# Patient Record
Sex: Female | Born: 1995 | Hispanic: Yes | Marital: Single | Smoking: Never smoker
Health system: Southern US, Community
[De-identification: ages and names within clinical notes are randomized; demographics above are authoritative.]

## PROBLEM LIST (undated history)

## (undated) DIAGNOSIS — F32A Depression, unspecified: Secondary | ICD-10-CM

## (undated) DIAGNOSIS — M419 Scoliosis, unspecified: Secondary | ICD-10-CM

## (undated) DIAGNOSIS — F419 Anxiety disorder, unspecified: Secondary | ICD-10-CM

## (undated) HISTORY — PX: ADENOIDECTOMY: SUR15

## (undated) HISTORY — DX: Anxiety disorder, unspecified: F41.9

## (undated) HISTORY — DX: Scoliosis, unspecified: M41.9

## (undated) HISTORY — PX: TONSILLECTOMY: SUR1361

## (undated) HISTORY — DX: Depression, unspecified: F32.A

---
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
## 2020-07-04 ENCOUNTER — Emergency Department (HOSPITAL_BASED_OUTPATIENT_CLINIC_OR_DEPARTMENT_OTHER): Payer: BC Managed Care – PPO

## 2020-07-04 ENCOUNTER — Encounter (HOSPITAL_BASED_OUTPATIENT_CLINIC_OR_DEPARTMENT_OTHER): Payer: Self-pay | Admitting: Emergency Medicine

## 2020-07-04 ENCOUNTER — Other Ambulatory Visit: Payer: Self-pay

## 2020-07-04 ENCOUNTER — Inpatient Hospital Stay (HOSPITAL_BASED_OUTPATIENT_CLINIC_OR_DEPARTMENT_OTHER)
Admission: EM | Admit: 2020-07-04 | Discharge: 2020-07-07 | DRG: 872 | Disposition: A | Discharge status: Home / Self Care | Payer: BC Managed Care – PPO | Attending: Family Medicine | Admitting: Family Medicine

## 2020-07-04 DIAGNOSIS — A419 Sepsis, unspecified organism: Secondary | ICD-10-CM | POA: Diagnosis present

## 2020-07-04 DIAGNOSIS — A4151 Sepsis due to Escherichia coli [E. coli]: Secondary | ICD-10-CM | POA: Diagnosis not present

## 2020-07-04 DIAGNOSIS — D649 Anemia, unspecified: Secondary | ICD-10-CM | POA: Diagnosis present

## 2020-07-04 DIAGNOSIS — Z20822 Contact with and (suspected) exposure to covid-19: Secondary | ICD-10-CM | POA: Diagnosis present

## 2020-07-04 DIAGNOSIS — E871 Hypo-osmolality and hyponatremia: Secondary | ICD-10-CM | POA: Diagnosis present

## 2020-07-04 DIAGNOSIS — R1031 Right lower quadrant pain: Secondary | ICD-10-CM | POA: Diagnosis not present

## 2020-07-04 DIAGNOSIS — Z885 Allergy status to narcotic agent status: Secondary | ICD-10-CM

## 2020-07-04 DIAGNOSIS — N12 Tubulo-interstitial nephritis, not specified as acute or chronic: Secondary | ICD-10-CM | POA: Diagnosis present

## 2020-07-04 DIAGNOSIS — F319 Bipolar disorder, unspecified: Secondary | ICD-10-CM | POA: Diagnosis present

## 2020-07-04 DIAGNOSIS — N10 Acute tubulo-interstitial nephritis: Secondary | ICD-10-CM | POA: Diagnosis present

## 2020-07-04 DIAGNOSIS — E860 Dehydration: Secondary | ICD-10-CM | POA: Diagnosis present

## 2020-07-04 DIAGNOSIS — D696 Thrombocytopenia, unspecified: Secondary | ICD-10-CM | POA: Diagnosis present

## 2020-07-04 DIAGNOSIS — E876 Hypokalemia: Secondary | ICD-10-CM | POA: Diagnosis present

## 2020-07-04 LAB — CBC WITH DIFFERENTIAL/PLATELET
Abs Immature Granulocytes: 0.07 10*3/uL (ref 0.00–0.07)
Basophils Absolute: 0 10*3/uL (ref 0.0–0.1)
Basophils Relative: 0 %
Eosinophils Absolute: 0 10*3/uL (ref 0.0–0.5)
Eosinophils Relative: 0 %
HCT: 37.5 % (ref 36.0–46.0)
Hemoglobin: 12.1 g/dL (ref 12.0–15.0)
Immature Granulocytes: 1 %
Lymphocytes Relative: 6 %
Lymphs Abs: 0.7 10*3/uL (ref 0.7–4.0)
MCH: 30.8 pg (ref 26.0–34.0)
MCHC: 32.3 g/dL (ref 30.0–36.0)
MCV: 95.4 fL (ref 80.0–100.0)
Monocytes Absolute: 0.7 10*3/uL (ref 0.1–1.0)
Monocytes Relative: 6 %
Neutro Abs: 10.4 10*3/uL — ABNORMAL HIGH (ref 1.7–7.7)
Neutrophils Relative %: 87 %
Platelets: 141 10*3/uL — ABNORMAL LOW (ref 150–400)
RBC: 3.93 MIL/uL (ref 3.87–5.11)
RDW: 13.1 % (ref 11.5–15.5)
WBC: 11.9 10*3/uL — ABNORMAL HIGH (ref 4.0–10.5)
nRBC: 0 % (ref 0.0–0.2)

## 2020-07-04 LAB — LACTIC ACID, PLASMA: Lactic Acid, Venous: 1.2 mmol/L (ref 0.5–1.9)

## 2020-07-04 LAB — APTT: aPTT: 31 seconds (ref 24–36)

## 2020-07-04 LAB — COMPREHENSIVE METABOLIC PANEL
ALT: 19 U/L (ref 0–44)
AST: 19 U/L (ref 15–41)
Albumin: 4.2 g/dL (ref 3.5–5.0)
Alkaline Phosphatase: 61 U/L (ref 38–126)
Anion gap: 12 (ref 5–15)
BUN: 8 mg/dL (ref 6–20)
CO2: 20 mmol/L — ABNORMAL LOW (ref 22–32)
Calcium: 8.7 mg/dL — ABNORMAL LOW (ref 8.9–10.3)
Chloride: 98 mmol/L (ref 98–111)
Creatinine, Ser: 0.7 mg/dL (ref 0.44–1.00)
GFR calc Af Amer: >60 mL/min (ref >60)
GFR calc non Af Amer: >60 mL/min (ref >60)
Glucose, Bld: 127 mg/dL — ABNORMAL HIGH (ref 70–99)
Potassium: 3.4 mmol/L — ABNORMAL LOW (ref 3.5–5.1)
Sodium: 130 mmol/L — ABNORMAL LOW (ref 135–145)
Total Bilirubin: 0.7 mg/dL (ref 0.3–1.2)
Total Protein: 7.8 g/dL (ref 6.5–8.1)

## 2020-07-04 LAB — HCG, SERUM, QUALITATIVE: Preg, Serum: NEGATIVE

## 2020-07-04 LAB — PROTIME-INR
INR: 1.1 (ref 0.8–1.2)
Prothrombin Time: 14 seconds (ref 11.4–15.2)

## 2020-07-04 MED ORDER — FENTANYL CITRATE (PF) 100 MCG/2ML IJ SOLN
50.0000 ug | Freq: Once | INTRAMUSCULAR | Status: AC
  Administered 2020-07-04: 50 ug via INTRAVENOUS
  Filled 2020-07-04: qty 2

## 2020-07-04 MED ORDER — IBUPROFEN 400 MG PO TABS
600.0000 mg | ORAL_TABLET | Freq: Once | ORAL | Status: AC
  Administered 2020-07-04: 600 mg via ORAL
  Filled 2020-07-04: qty 1

## 2020-07-04 MED ORDER — SODIUM CHLORIDE 0.9 % IV SOLN
2.0000 g | Freq: Once | INTRAVENOUS | Status: AC
  Administered 2020-07-04: 2 g via INTRAVENOUS
  Filled 2020-07-04: qty 20

## 2020-07-04 MED ORDER — LACTATED RINGERS IV SOLN: INTRAVENOUS | Status: DC

## 2020-07-04 MED ORDER — METRONIDAZOLE IN NACL 5-0.79 MG/ML-% IV SOLN
500.0000 mg | Freq: Once | INTRAVENOUS | Status: AC
  Administered 2020-07-05: 500 mg via INTRAVENOUS
  Filled 2020-07-04: qty 100

## 2020-07-04 MED ORDER — ACETAMINOPHEN 325 MG PO TABS
650.0000 mg | ORAL_TABLET | Freq: Once | ORAL | Status: AC | PRN
  Administered 2020-07-04: 650 mg via ORAL
  Filled 2020-07-04: qty 2

## 2020-07-04 MED ORDER — ONDANSETRON HCL 4 MG/2ML IJ SOLN
4.0000 mg | Freq: Once | INTRAMUSCULAR | Status: AC
  Administered 2020-07-04: 4 mg via INTRAVENOUS
  Filled 2020-07-04: qty 2

## 2020-07-04 MED ORDER — LACTATED RINGERS IV BOLUS (SEPSIS)
1000.0000 mL | Freq: Once | INTRAVENOUS | Status: AC
  Administered 2020-07-04: 1000 mL via INTRAVENOUS

## 2020-07-04 MED ORDER — LACTATED RINGERS IV BOLUS (SEPSIS)
250.0000 mL | Freq: Once | INTRAVENOUS | Status: AC
  Administered 2020-07-05: 250 mL via INTRAVENOUS

## 2020-07-04 MED ORDER — LACTATED RINGERS IV BOLUS (SEPSIS)
500.0000 mL | Freq: Once | INTRAVENOUS | Status: AC
  Administered 2020-07-05: 500 mL via INTRAVENOUS

## 2020-07-04 NOTE — ED Triage Notes (Signed)
Pt arrives pov with parents with c/o RLQ pain x5 days, N/V since last night and constipation x 2 days Pt febrile 103.3 in triage. Pt also reports decreased appetite.

## 2020-07-04 NOTE — ED Notes (Signed)
IV attempted in right hand without success. Second attempt successful, see LDA flowsheet.

## 2020-07-05 ENCOUNTER — Emergency Department (HOSPITAL_BASED_OUTPATIENT_CLINIC_OR_DEPARTMENT_OTHER): Payer: BC Managed Care – PPO

## 2020-07-05 ENCOUNTER — Encounter (HOSPITAL_BASED_OUTPATIENT_CLINIC_OR_DEPARTMENT_OTHER): Payer: Self-pay

## 2020-07-05 DIAGNOSIS — Z885 Allergy status to narcotic agent status: Secondary | ICD-10-CM | POA: Diagnosis not present

## 2020-07-05 DIAGNOSIS — N12 Tubulo-interstitial nephritis, not specified as acute or chronic: Secondary | ICD-10-CM | POA: Diagnosis not present

## 2020-07-05 DIAGNOSIS — D649 Anemia, unspecified: Secondary | ICD-10-CM | POA: Diagnosis present

## 2020-07-05 DIAGNOSIS — E871 Hypo-osmolality and hyponatremia: Secondary | ICD-10-CM | POA: Diagnosis present

## 2020-07-05 DIAGNOSIS — Z20822 Contact with and (suspected) exposure to covid-19: Secondary | ICD-10-CM | POA: Diagnosis present

## 2020-07-05 DIAGNOSIS — E860 Dehydration: Secondary | ICD-10-CM | POA: Diagnosis present

## 2020-07-05 DIAGNOSIS — D696 Thrombocytopenia, unspecified: Secondary | ICD-10-CM

## 2020-07-05 DIAGNOSIS — F319 Bipolar disorder, unspecified: Secondary | ICD-10-CM | POA: Diagnosis present

## 2020-07-05 DIAGNOSIS — E876 Hypokalemia: Secondary | ICD-10-CM | POA: Diagnosis present

## 2020-07-05 DIAGNOSIS — N10 Acute tubulo-interstitial nephritis: Secondary | ICD-10-CM | POA: Diagnosis present

## 2020-07-05 DIAGNOSIS — A4151 Sepsis due to Escherichia coli [E. coli]: Secondary | ICD-10-CM | POA: Diagnosis present

## 2020-07-05 DIAGNOSIS — A419 Sepsis, unspecified organism: Secondary | ICD-10-CM | POA: Diagnosis present

## 2020-07-05 DIAGNOSIS — R1031 Right lower quadrant pain: Secondary | ICD-10-CM | POA: Diagnosis present

## 2020-07-05 HISTORY — DX: Bipolar disorder, unspecified: F31.9

## 2020-07-05 HISTORY — DX: Tubulo-interstitial nephritis, not specified as acute or chronic: N12

## 2020-07-05 LAB — BASIC METABOLIC PANEL
Anion gap: 8 (ref 5–15)
BUN: <5 mg/dL — ABNORMAL LOW (ref 6–20)
CO2: 21 mmol/L — ABNORMAL LOW (ref 22–32)
Calcium: 8.6 mg/dL — ABNORMAL LOW (ref 8.9–10.3)
Chloride: 108 mmol/L (ref 98–111)
Creatinine, Ser: 0.59 mg/dL (ref 0.44–1.00)
GFR calc Af Amer: >60 mL/min (ref >60)
GFR calc non Af Amer: >60 mL/min (ref >60)
Glucose, Bld: 133 mg/dL — ABNORMAL HIGH (ref 70–99)
Potassium: 3.6 mmol/L (ref 3.5–5.1)
Sodium: 137 mmol/L (ref 135–145)

## 2020-07-05 LAB — URINALYSIS, MICROSCOPIC (REFLEX): WBC, UA: >50 WBC/hpf (ref 0–5)

## 2020-07-05 LAB — URINALYSIS, ROUTINE W REFLEX MICROSCOPIC
Bilirubin Urine: NEGATIVE
Glucose, UA: NEGATIVE mg/dL
Ketones, ur: >80 mg/dL — AB
Nitrite: POSITIVE — AB
Protein, ur: 100 mg/dL — AB
Specific Gravity, Urine: 1.025 (ref 1.005–1.030)
pH: 6 (ref 5.0–8.0)

## 2020-07-05 LAB — URINE CULTURE

## 2020-07-05 LAB — CBC
HCT: 30.1 % — ABNORMAL LOW (ref 36.0–46.0)
Hemoglobin: 9.8 g/dL — ABNORMAL LOW (ref 12.0–15.0)
MCH: 31.1 pg (ref 26.0–34.0)
MCHC: 32.6 g/dL (ref 30.0–36.0)
MCV: 95.6 fL (ref 80.0–100.0)
Platelets: 103 10*3/uL — ABNORMAL LOW (ref 150–400)
RBC: 3.15 MIL/uL — ABNORMAL LOW (ref 3.87–5.11)
RDW: 13.4 % (ref 11.5–15.5)
WBC: 8.1 10*3/uL (ref 4.0–10.5)
nRBC: 0 % (ref 0.0–0.2)

## 2020-07-05 LAB — SARS CORONAVIRUS 2 BY RT PCR (HOSPITAL ORDER, PERFORMED IN ~~LOC~~ HOSPITAL LAB): SARS Coronavirus 2: NEGATIVE

## 2020-07-05 LAB — HIV ANTIBODY (ROUTINE TESTING W REFLEX): HIV Screen 4th Generation wRfx: NONREACTIVE

## 2020-07-05 MED ORDER — ONDANSETRON HCL 4 MG PO TABS: 4.0000 mg | ORAL_TABLET | Freq: Four times a day (QID) | ORAL | Status: DC | PRN

## 2020-07-05 MED ORDER — LIDOCAINE VISCOUS HCL 2 % MT SOLN
15.0000 mL | Freq: Once | OROMUCOSAL | Status: AC
  Administered 2020-07-05: 15 mL via ORAL
  Filled 2020-07-05: qty 15

## 2020-07-05 MED ORDER — IOHEXOL 300 MG/ML  SOLN
80.0000 mL | Freq: Once | INTRAMUSCULAR | Status: AC | PRN
  Administered 2020-07-05: 80 mL via INTRAVENOUS

## 2020-07-05 MED ORDER — SENNOSIDES-DOCUSATE SODIUM 8.6-50 MG PO TABS
1.0000 | ORAL_TABLET | Freq: Two times a day (BID) | ORAL | Status: DC
  Administered 2020-07-05 – 2020-07-07 (×5): 1 via ORAL
  Filled 2020-07-05 (×6): qty 1

## 2020-07-05 MED ORDER — KETOROLAC TROMETHAMINE 30 MG/ML IJ SOLN
30.0000 mg | Freq: Four times a day (QID) | INTRAMUSCULAR | Status: DC | PRN
  Administered 2020-07-05 – 2020-07-06 (×3): 30 mg via INTRAVENOUS
  Filled 2020-07-05 (×3): qty 1

## 2020-07-05 MED ORDER — SODIUM CHLORIDE 0.9 % IV SOLN
1.0000 g | INTRAVENOUS | Status: DC
  Administered 2020-07-05 – 2020-07-06 (×2): 1 g via INTRAVENOUS
  Filled 2020-07-05 (×2): qty 10

## 2020-07-05 MED ORDER — CALCIUM GLUCONATE-NACL 1-0.675 GM/50ML-% IV SOLN
1.0000 g | Freq: Once | INTRAVENOUS | Status: AC
  Administered 2020-07-05: 1000 mg via INTRAVENOUS
  Filled 2020-07-05: qty 50

## 2020-07-05 MED ORDER — ONDANSETRON HCL 4 MG/2ML IJ SOLN
4.0000 mg | Freq: Once | INTRAMUSCULAR | Status: AC
  Administered 2020-07-05: 4 mg via INTRAVENOUS
  Filled 2020-07-05: qty 2

## 2020-07-05 MED ORDER — HYDROCODONE-ACETAMINOPHEN 5-325 MG PO TABS
1.0000 | ORAL_TABLET | Freq: Four times a day (QID) | ORAL | Status: DC | PRN
  Administered 2020-07-05 – 2020-07-06 (×5): 1 via ORAL
  Filled 2020-07-05 (×4): qty 1

## 2020-07-05 MED ORDER — ACETAMINOPHEN 325 MG PO TABS
650.0000 mg | ORAL_TABLET | Freq: Four times a day (QID) | ORAL | Status: DC | PRN
  Administered 2020-07-05 – 2020-07-07 (×3): 650 mg via ORAL
  Filled 2020-07-05 (×3): qty 2

## 2020-07-05 MED ORDER — ONDANSETRON HCL 4 MG/2ML IJ SOLN: 4.0000 mg | Freq: Four times a day (QID) | INTRAMUSCULAR | Status: DC | PRN

## 2020-07-05 MED ORDER — POTASSIUM CHLORIDE CRYS ER 20 MEQ PO TBCR
20.0000 meq | EXTENDED_RELEASE_TABLET | ORAL | Status: AC
  Administered 2020-07-05: 20 meq via ORAL
  Filled 2020-07-05: qty 1

## 2020-07-05 MED ORDER — ACETAMINOPHEN 650 MG RE SUPP: 650.0000 mg | Freq: Four times a day (QID) | RECTAL | Status: DC | PRN

## 2020-07-05 MED ORDER — SODIUM CHLORIDE 0.9% FLUSH
3.0000 mL | Freq: Two times a day (BID) | INTRAVENOUS | Status: DC
  Administered 2020-07-05 – 2020-07-07 (×5): 3 mL via INTRAVENOUS

## 2020-07-05 MED ORDER — ARIPIPRAZOLE 5 MG PO TABS
5.0000 mg | ORAL_TABLET | Freq: Every day | ORAL | Status: DC
  Administered 2020-07-05 – 2020-07-06 (×2): 5 mg via ORAL
  Filled 2020-07-05 (×2): qty 1

## 2020-07-05 MED ORDER — ENOXAPARIN SODIUM 40 MG/0.4ML ~~LOC~~ SOLN
40.0000 mg | SUBCUTANEOUS | Status: DC
  Administered 2020-07-05 – 2020-07-06 (×2): 40 mg via SUBCUTANEOUS
  Filled 2020-07-05 (×2): qty 0.4

## 2020-07-05 MED ORDER — ALBUTEROL SULFATE (2.5 MG/3ML) 0.083% IN NEBU: 2.5000 mg | INHALATION_SOLUTION | Freq: Four times a day (QID) | RESPIRATORY_TRACT | Status: DC | PRN

## 2020-07-05 MED ORDER — ALUM & MAG HYDROXIDE-SIMETH 200-200-20 MG/5ML PO SUSP
30.0000 mL | Freq: Once | ORAL | Status: AC
  Administered 2020-07-05: 30 mL via ORAL
  Filled 2020-07-05: qty 30

## 2020-07-05 MED ORDER — HYDROCODONE-ACETAMINOPHEN 5-325 MG PO TABS
1.0000 | ORAL_TABLET | Freq: Once | ORAL | Status: AC
  Filled 2020-07-05: qty 1

## 2020-07-05 MED ORDER — KETOROLAC TROMETHAMINE 30 MG/ML IJ SOLN
30.0000 mg | Freq: Once | INTRAMUSCULAR | Status: AC
  Administered 2020-07-05: 30 mg via INTRAVENOUS
  Filled 2020-07-05: qty 1

## 2020-07-05 NOTE — Progress Notes (Addendum)
Repeat lab work from today revealed hemoglobin 9.8 platelet count 103.  Potassium and sodium levels now within normal limits.  Suspect possibly a dilutional effect as all 3 cell lines are decreased at this time.  Denies any reports of bleeding.  Will discontinue IV fluids as patient tolerating p.o. at this time and recheck labs in a.m.

## 2020-07-05 NOTE — H&P (Addendum)
History and Physical    Chelsea Hammond SEG:[id number] DOB: [birth year] DOA: 07/04/2020  Referring MD/NP/PA: Trey Paula PCP: Nelwyn Salisbury, MD  Patient coming from: [facility] transfer  Chief Complaint: Abdominal pain  I have personally briefly reviewed patient's old medical records in [facility]   HPI: Chelsea Hammond is a 24 y.o. female with medical history significant of bipolar disorder presented with complaints of right-sided abdominal pain which started approximately 5-6 days ago.  Describes it as a sharp and crampy pain that was constant.  Noted associated symptoms of subjective fever, nausea, vomiting, and constipation.  She had been shaking Aleve at home without any improvement in symptoms.  Patient reports that she has never had a urinary tract infection before in the past.  Denied having any blood in stool or urine.  That she has a yellowish discoloration of her face that is new.   ED Course: Upon admission into the emergency department patient was seen to be febrile up to 103.3 F, pulse up to 145, respirations up to 24, blood pressure 86/54-115/77, and O2 saturation maintained.  Labs revealed WBC 11.9, sodium 130, potassium 3.4, creatinine 0.7, calcium 8.7, and lactic acid 1.2.  Urine pregnancy was negative.  Urinalysis was positive for large hemoglobin, ketones, moderate leukocytes, positive nitrites, many bacteria, 21-50 squamous epithelial cells, and greater than 50 WBCs.  CT scan showed abnormal enhancement around the right kidney with periureteral stranding and diffuse bladder wall thickening.  Sepsis protocol has been initiated with blood and urine cultures.  Patient received 1.75 L of lactated Ringer's, Tylenol, pain medications, Rocephin, and metronidazole.  Patient accepted to a MedSurg bed at [facility].  COVID-19 screening was negative.  Review of Systems  Constitutional: Positive for fever and malaise/fatigue.  HENT: Negative for nosebleeds and sinus pain.    Eyes: Negative for photophobia and pain.  Respiratory: Negative for cough and shortness of breath.   Cardiovascular: Negative for chest pain and leg swelling.  Gastrointestinal: Positive for abdominal pain, nausea and vomiting.  Genitourinary: Positive for flank pain.  Musculoskeletal: Negative for joint pain and myalgias.  Skin: Negative for rash.  Neurological: Negative for seizures and weakness.  Endo/Heme/Allergies: Bruises/bleeds easily.  Psychiatric/Behavioral: Negative for substance abuse.    History reviewed. No pertinent past medical history.  Past Surgical History:  Procedure Laterality Date   ADENOIDECTOMY     TONSILLECTOMY       reports that she has never smoked. She has never used smokeless tobacco. She reports that she does not drink alcohol and does not use drugs.  Allergies  Allergen Reactions   Codeine     tachycardia    History reviewed. No pertinent family history.  Prior to Admission medications   Medication Sig Start Date End Date Taking? Authorizing Provider  ARIPiprazole (ABILIFY) 5 MG tablet Take 5 mg by mouth at bedtime. 06/29/20   [provider]    Physical Exam:  Constitutional: Young female who appears to be NAD, calm, comfortable Vitals:   07/05/20 0555 07/05/20 0600 07/05/20 0605 07/05/20 0704  BP: 91/60 (!) 86/54 (!) 87/58 109/62  Pulse: (!) 115 (!) 112 (!) 119 98  Resp: 16 19 16 19   Temp: 98.2 F (36.8 C)   98.4 F (36.9 C)  TempSrc: Oral   Oral  SpO2: 98% 98% 99% 99%  Weight:      Height:       Eyes: PERRL, lids and conjunctivae normal ENMT: Mucous membranes are moist. Posterior pharynx clear of any  exudate or lesions. Normal dentition.   Neck: normal, supple, no masses, no thyromegaly Respiratory: clear to auscultation bilaterally, no wheezing, no crackles. Normal respiratory effort. No accessory muscle use.  Cardiovascular: Regular rate and rhythm, no murmurs / rubs / gallops. No extremity edema. 2+ pedal pulses.  No carotid bruits.  Abdomen: Right CVA and right lower quadrant abdominal tenderness to palpation. No masses palpated. No hepatosplenomegaly. Bowel sounds positive.  Musculoskeletal: no clubbing / cyanosis. No joint deformity upper and lower extremities. Good ROM, no contractures. Normal muscle tone.  Skin: no rashes, lesions, ulcers. No induration Neurologic: CN 2-12 grossly intact. Sensation intact, DTR normal. Strength 5/5 in all 4.  Psychiatric: Normal judgment and insight. Alert and oriented x 3. Normal mood.     Labs on Admission: I have personally reviewed following labs and imaging studies  CBC: Recent Labs  Lab 07/04/20 2259  WBC 11.9*  NEUTROABS 10.4*  HGB 12.1  HCT 37.5  MCV 95.4  PLT 141*   Basic Metabolic Panel: Recent Labs  Lab 07/04/20 2259  NA 130*  K 3.4*  CL 98  CO2 20*  GLUCOSE 127*  BUN 8  CREATININE 0.70  CALCIUM 8.7*   GFR: Estimated Creatinine Clearance: 97.9 mL/min (by C-G formula based on SCr of 0.7 mg/dL). Liver Function Tests: Recent Labs  Lab 07/04/20 2259  AST 19  ALT 19  ALKPHOS 61  BILITOT 0.7  PROT 7.8  ALBUMIN 4.2   No results for input(s): LIPASE, AMYLASE in the last 168 hours. No results for input(s): AMMONIA in the last 168 hours. Coagulation Profile: Recent Labs  Lab 07/04/20 2320  INR 1.1   Cardiac Enzymes: No results for input(s): CKTOTAL, CKMB, CKMBINDEX, TROPONINI in the last 168 hours. BNP (last 3 results) No results for input(s): PROBNP in the last 8760 hours. HbA1C: No results for input(s): HGBA1C in the last 72 hours. CBG: No results for input(s): GLUCAP in the last 168 hours. Lipid Profile: No results for input(s): CHOL, HDL, LDLCALC, TRIG, CHOLHDL, LDLDIRECT in the last 72 hours. Thyroid Function Tests: No results for input(s): TSH, T4TOTAL, FREET4, T3FREE, THYROIDAB in the last 72 hours. Anemia Panel: No results for input(s): VITAMINB12, FOLATE, FERRITIN, TIBC, IRON, RETICCTPCT in the last 72  hours. Urine analysis:    Component Value Date/Time   COLORURINE YELLOW 07/05/2020 0009   APPEARANCEUR TURBID (A) 07/05/2020 0009   LABSPEC 1.025 07/05/2020 0009   PHURINE 6.0 07/05/2020 0009   GLUCOSEU NEGATIVE 07/05/2020 0009   HGBUR LARGE (A) 07/05/2020 0009   BILIRUBINUR NEGATIVE 07/05/2020 0009   KETONESUR >80 (A) 07/05/2020 0009   PROTEINUR 100 (A) 07/05/2020 0009   NITRITE POSITIVE (A) 07/05/2020 0009   LEUKOCYTESUR MODERATE (A) 07/05/2020 0009   Sepsis Labs: Recent Results (from the past 240 hour(s))  SARS Coronavirus 2 by RT PCR (hospital order, performed in [facility] hospital lab) Nasopharyngeal Nasopharyngeal Swab     Status: None   Collection Time: 07/04/20 11:46 PM   Specimen: Nasopharyngeal Swab  Result Value Ref Range Status   SARS Coronavirus 2 NEGATIVE NEGATIVE Final    Comment: (NOTE) SARS-CoV-2 target nucleic acids are NOT DETECTED.  The SARS-CoV-2 RNA is generally detectable in upper and lower respiratory specimens during the acute phase of infection. The lowest concentration of SARS-CoV-2 viral copies this assay can detect is 250 copies / mL. A negative result does not preclude SARS-CoV-2 infection and should not be used as the sole basis for treatment or other patient management decisions.  A negative result may occur with improper specimen collection / handling, submission of specimen other than nasopharyngeal swab, presence of viral mutation(s) within the areas targeted by this assay, and inadequate number of viral copies (<250 copies / mL). A negative result must be combined with clinical observations, patient history, and epidemiological information.  Fact Sheet for Patients:   BoilerBrush.com.cyhttps://www.fda.gov/media/136312/download  Fact Sheet for Healthcare Providers: https://pope.com/https://www.fda.gov/media/136313/download  This test is not yet approved or  cleared by the [place] FDA and has been authorized for detection and/or diagnosis of SARS-CoV-2 by FDA  under an Emergency Use Authorization (EUA).  This EUA will remain in effect (meaning this test can be used) for the duration of the COVID-19 declaration under Section 564(b)(1) of the Act, 21 U.S.C. section 360bbb-3(b)(1), unless the authorization is terminated or revoked sooner.  Performed at [facility] [place], [address] [place], [place] [address]      Radiological Exams on Admission: DG Chest 2 View  Result Date: 07/05/2020 CLINICAL DATA:  Tachycardia, right lower quadrant abdominal pain EXAM: CHEST - 2 VIEW COMPARISON:  None. FINDINGS: Lungs are clear. No pneumothorax or pleural effusion. Cardiac size within normal limits. Pulmonary vascularity normal. 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 No acute bone abnormality. IMPRESSION: No active cardiopulmonary disease. Electronically Signed   By: Helyn NumbersAshesh  Parikh MD   On: 07/05/2020 01:24   CT ABDOMEN PELVIS W CONTRAST  Result Date: 07/05/2020 CLINICAL DATA:  Abdominal pain EXAM: CT ABDOMEN AND PELVIS WITH CONTRAST TECHNIQUE: Multidetector CT imaging of the abdomen and pelvis was performed using the standard protocol following bolus administration of intravenous contrast. CONTRAST:  80mL OMNIPAQUE IOHEXOL 300 MG/ML  SOLN COMPARISON:  None. FINDINGS: LOWER CHEST: Normal. HEPATOBILIARY: Normal hepatic contours. No intra- or extrahepatic biliary dilatation. Normal gallbladder. PANCREAS: Normal pancreas. No ductal dilatation or peripancreatic fluid collection. SPLEEN: Normal. ADRENALS/URINARY TRACT: The adrenal glands are normal. There is an abnormal enhancement pattern of the right kidney. There is periureteral stranding along the right ureter. There is diffuse thickening of the urinary bladder wall. STOMACH/BOWEL: There is no hiatal hernia. Normal duodenal course and caliber. No small bowel dilatation or inflammation. No focal colonic abnormality. Normal appendix. VASCULAR/LYMPHATIC: Normal  course and caliber of the major abdominal vessels. No abdominal or pelvic lymphadenopathy. REPRODUCTIVE: Normal uterus. No adnexal mass. MUSCULOSKELETAL. No bony spinal canal stenosis or focal osseous abnormality. Lumbar levoscoliosis. OTHER: None. IMPRESSION: Abnormal enhancement pattern of the right kidney with periureteral stranding and diffuse thickening of the urinary bladder wall, consistent with ascending urinary tract infection and pyelonephritis. Electronically Signed   By: Deatra RobinsonKevin  Herman M.D.   On: 07/05/2020 00:56    EKG: Independently reviewed.  Sinus tachycardia 132 bpm  Assessment/Plan  Sepsis secondary to pyelonephritis: Acute.  Patient present with complaints of abdominal pain.  Noted to be febrile up to 103.3 F with tachycardia, tachypnea, and WBC 11.9.  Lactic acid was noted to be reassuring at 1.2.  Urinalysis was positive for signs of infection.  Imaging studies revealed concern of pyelonephritis of the right kidney with inflammation of the bladder concerning for UTI and appendix was noted to appear normal.  Sepsis protocol was initiated and patient had been started on empiric antibiotics of metronidazole and Rocephin. -Admit to a MedSurg bed -Follow-up blood and urine cultures -Continue empiric antibiotics Rocephin day 2 -Continue lactated Ringer's at 125 mL/h for 1 additional liter of fluid as patient appears to please received  2+ liters of fluid at this time -Tylenol as needed for fever -Toradol as needed for pain -Continue to monitor  Hypokalemia Hyponatremia  Hypocalcemia: Acute.  On admission labs from 8/22 sodium was noted to be 130, potassium 3.4, and calcium 8.7.  -Give 20 mEq of potassium chloride p.o. and 1 g of calcium gluconate IV -Continue to monitor and replace electrolytes as needed  Thrombocytopenia: Acute.  Platelet count  147.  INR/PT and aPTT were within normal limits. -Will continue to monitor and consider additional work-up if needed  Bipolar  disorder: Patient on Abilify 5 mg nightly -Continue Abilify  DVT prophylaxis: Lovenox Code Status: Full Family Communication: Plan of care discussed with the patient's mom present at bedside Disposition Plan: Possibly discharge home in 2 to 3 days  Consults called: None Admission status: Inpatient  Clydie Braun MD [facility] Pager [phone]   If 7PM-7AM, please contact night-coverage [email or web address] Password [facility]  07/05/2020, 7:52 AM

## 2020-07-05 NOTE — ED Notes (Signed)
Patient transported to CT 

## 2020-07-05 NOTE — ED Provider Notes (Signed)
[facility] EMERGENCY DEPARTMENT Provider Note   CSN: [id number] Arrival date & time: 07/04/20  2234     History Chief Complaint  Patient presents with  . Abdominal Pain    Chelsea Hammond is a 24 y.o. female.  The history is provided by the patient.  Abdominal Pain Pain location:  RLQ Pain quality: cramping and sharp   Pain radiates to:  Does not radiate Pain severity:  Severe Onset quality:  Gradual Duration:  5 days Timing:  Constant Progression:  Unchanged Chronicity:  New Context: not sick contacts   Relieved by:  Nothing Worsened by:  Nothing Ineffective treatments:  Acetaminophen Associated symptoms: constipation, fever, nausea and vomiting   Associated symptoms: no chest pain, no cough, no dysuria, no shortness of breath and no sore throat   Risk factors: has not had multiple surgeries and not pregnant        History reviewed. No pertinent past medical history.  There are no problems to display for this patient.   Past Surgical History:  Procedure Laterality Date  . ADENOIDECTOMY    . TONSILLECTOMY       OB History   No obstetric history on file.     History reviewed. No pertinent family history.  Social History   Tobacco Use  . Smoking status: Never Smoker  . Smokeless tobacco: Never Used  Substance Use Topics  . Alcohol use: No    Alcohol/week: 0.0 standard drinks  . Drug use: No    Home Medications Prior to Admission medications   Medication Sig Start Date End Date Taking? Authorizing Provider  ARIPiprazole (ABILIFY) 5 MG tablet Take 5 mg by mouth at bedtime. 06/29/20   [provider]    Allergies    Codeine  Review of Systems   Review of Systems  Constitutional: Positive for fever.  HENT: Negative for sore throat.   Eyes: Negative for visual disturbance.  Respiratory: Negative for cough and shortness of breath.   Cardiovascular: Negative for chest pain.  Gastrointestinal: Positive for abdominal pain,  constipation, nausea and vomiting.  Genitourinary: Negative for dysuria.  Musculoskeletal: Negative for arthralgias.  Skin: Negative for rash.  Neurological: Negative for dizziness.  Psychiatric/Behavioral: Negative for agitation.  All other systems reviewed and are negative.   Physical Exam Updated Vital Signs BP 109/70 (BP Location: Left Arm)   Pulse (!) 116   Temp 99.6 F (37.6 C) (Oral)   Resp 12   Ht 5\' 5"  (1.651 m)   Wt 56.2 kg   LMP  (LMP Unknown)   SpO2 99%   BMI 20.63 kg/m   Physical Exam Vitals and nursing note reviewed.  Constitutional:      Appearance: Normal appearance. She is not diaphoretic.  HENT:     Head: Normocephalic and atraumatic.     Nose: Nose normal.  Eyes:     Conjunctiva/sclera: Conjunctivae normal.     Pupils: Pupils are equal, round, and reactive to light.  Cardiovascular:     Rate and Rhythm: Regular rhythm. Tachycardia present.     Pulses: Normal pulses.     Heart sounds: Normal heart sounds.  Pulmonary:     Effort: Pulmonary effort is normal.     Breath sounds: Normal breath sounds.  Abdominal:     General: Abdomen is flat. Bowel sounds are normal.     Palpations: Abdomen is soft.     Tenderness: There is abdominal tenderness. There is no guarding or rebound.     Hernia: No  hernia is present.  Musculoskeletal:        General: Normal range of motion.     Cervical back: Normal range of motion and neck supple.  Skin:    General: Skin is warm and dry.     Capillary Refill: Capillary refill takes less than 2 seconds.  Neurological:     General: No focal deficit present.     Mental Status: She is alert and oriented to person, place, and time.     Deep Tendon Reflexes: Reflexes normal.  Psychiatric:        Mood and Affect: Mood normal.        Behavior: Behavior normal.     ED Results / Procedures / Treatments   Labs (all labs ordered are listed, but only abnormal results are displayed) Results for orders placed or performed  during the hospital encounter of 07/04/20  SARS Coronavirus 2 by RT PCR (hospital order, performed in [facility] hospital lab) Nasopharyngeal Nasopharyngeal Swab   Specimen: Nasopharyngeal Swab  Result Value Ref Range   SARS Coronavirus 2 NEGATIVE NEGATIVE  Lactic acid, plasma  Result Value Ref Range   Lactic Acid, Venous 1.2 0.5 - 1.9 mmol/L  Comprehensive metabolic panel  Result Value Ref Range   Sodium 130 (L) 135 - 145 mmol/L   Potassium 3.4 (L) 3.5 - 5.1 mmol/L   Chloride 98 98 - 111 mmol/L   CO2 20 (L) 22 - 32 mmol/L   Glucose, Bld 127 (H) 70 - 99 mg/dL   BUN 8 6 - 20 mg/dL   Creatinine, Ser 5.53 0.44 - 1.00 mg/dL   Calcium 8.7 (L) 8.9 - 10.3 mg/dL   Total Protein 7.8 6.5 - 8.1 g/dL   Albumin 4.2 3.5 - 5.0 g/dL   AST 19 15 - 41 U/L   ALT 19 0 - 44 U/L   Alkaline Phosphatase 61 38 - 126 U/L   Total Bilirubin 0.7 0.3 - 1.2 mg/dL   GFR calc non Af Amer >60 >60 mL/min   GFR calc Af Amer >60 >60 mL/min   Anion gap 12 5 - 15  CBC with Differential  Result Value Ref Range   WBC 11.9 (H) 4.0 - 10.5 K/uL   RBC 3.93 3.87 - 5.11 MIL/uL   Hemoglobin 12.1 12.0 - 15.0 g/dL   HCT 74.8 36 - 46 %   MCV 95.4 80.0 - 100.0 fL   MCH 30.8 26.0 - 34.0 pg   MCHC 32.3 30.0 - 36.0 g/dL   RDW 27.0 78.6 - 75.4 %   Platelets 141 (L) 150 - 400 K/uL   nRBC 0.0 0.0 - 0.2 %   Neutrophils Relative % 87 %   Neutro Abs 10.4 (H) 1.7 - 7.7 K/uL   Lymphocytes Relative 6 %   Lymphs Abs 0.7 0.7 - 4.0 K/uL   Monocytes Relative 6 %   Monocytes Absolute 0.7 0 - 1 K/uL   Eosinophils Relative 0 %   Eosinophils Absolute 0.0 0 - 0 K/uL   Basophils Relative 0 %   Basophils Absolute 0.0 0 - 0 K/uL   Immature Granulocytes 1 %   Abs Immature Granulocytes 0.07 0.00 - 0.07 K/uL  Urinalysis, Routine w reflex microscopic  Result Value Ref Range   Color, Urine YELLOW YELLOW   APPearance TURBID (A) CLEAR   Specific Gravity, Urine 1.025 1.005 - 1.030   pH 6.0 5.0 - 8.0   Glucose, UA NEGATIVE NEGATIVE mg/dL    Hgb urine dipstick LARGE (  A) NEGATIVE   Bilirubin Urine NEGATIVE NEGATIVE   Ketones, ur >80 (A) NEGATIVE mg/dL   Protein, ur 981 (A) NEGATIVE mg/dL   Nitrite POSITIVE (A) NEGATIVE   Leukocytes,Ua MODERATE (A) NEGATIVE  hCG, serum, qualitative  Result Value Ref Range   Preg, Serum NEGATIVE NEGATIVE  Protime-INR  Result Value Ref Range   Prothrombin Time 14.0 11.4 - 15.2 seconds   INR 1.1 0.8 - 1.2  APTT  Result Value Ref Range   aPTT 31 24 - 36 seconds  Urinalysis, Microscopic (reflex)  Result Value Ref Range   RBC / HPF 6-10 0 - 5 RBC/hpf   WBC, UA >50 0 - 5 WBC/hpf   Bacteria, UA MANY (A) NONE SEEN   Squamous Epithelial / LPF 21-50 0 - 5   Non Squamous Epithelial PRESENT (A) NONE SEEN   WBC Clumps PRESENT    Mucus PRESENT    DG Chest 2 View  Result Date: 07/05/2020 CLINICAL DATA:  Tachycardia, right lower quadrant abdominal pain EXAM: CHEST - 2 VIEW COMPARISON:  None. FINDINGS: Lungs are clear. No pneumothorax or pleural effusion. Cardiac size within normal limits. Pulmonary vascularity normal. 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 No acute bone abnormality. IMPRESSION: No active cardiopulmonary disease. Electronically Signed   By: Helyn Numbers MD   On: 07/05/2020 01:24   CT ABDOMEN PELVIS W CONTRAST  Result Date: 07/05/2020 CLINICAL DATA:  Abdominal pain EXAM: CT ABDOMEN AND PELVIS WITH CONTRAST TECHNIQUE: Multidetector CT imaging of the abdomen and pelvis was performed using the standard protocol following bolus administration of intravenous contrast. CONTRAST:  80mL OMNIPAQUE IOHEXOL 300 MG/ML  SOLN COMPARISON:  None. FINDINGS: LOWER CHEST: Normal. HEPATOBILIARY: Normal hepatic contours. No intra- or extrahepatic biliary dilatation. Normal gallbladder. PANCREAS: Normal pancreas. No ductal dilatation or peripancreatic fluid collection. SPLEEN: Normal. ADRENALS/URINARY TRACT: The adrenal glands are normal. There is an abnormal  enhancement pattern of the right kidney. There is periureteral stranding along the right ureter. There is diffuse thickening of the urinary bladder wall. STOMACH/BOWEL: There is no hiatal hernia. Normal duodenal course and caliber. No small bowel dilatation or inflammation. No focal colonic abnormality. Normal appendix. VASCULAR/LYMPHATIC: Normal course and caliber of the major abdominal vessels. No abdominal or pelvic lymphadenopathy. REPRODUCTIVE: Normal uterus. No adnexal mass. MUSCULOSKELETAL. No bony spinal canal stenosis or focal osseous abnormality. Lumbar levoscoliosis. OTHER: None. IMPRESSION: Abnormal enhancement pattern of the right kidney with periureteral stranding and diffuse thickening of the urinary bladder wall, consistent with ascending urinary tract infection and pyelonephritis. Electronically Signed   By: Deatra Robinson M.D.   On: 07/05/2020 00:56    EKG  EKG Interpretation  Date/Time:  Sunday July 04 2020 23:03:59 EDT Ventricular Rate:  132 PR Interval:    QRS Duration: 97 QT Interval:  281 QTC Calculation: 417 R Axis:   73 Text Interpretation: Sinus tachycardia Confirmed by Sheyla Zaffino ([id number]) on 07/05/2020 1:52:56 AM       Radiology DG Chest 2 View  Result Date: 07/05/2020 CLINICAL DATA:  Tachycardia, right lower quadrant abdominal pain EXAM: CHEST - 2 VIEW COMPARISON:  None. FINDINGS: Lungs are clear. No pneumothorax or pleural effusion. Cardiac size within normal limits. Pulmonary vascularity normal. 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 No acute bone abnormality. IMPRESSION: No active cardiopulmonary disease. Electronically Signed   By: Helyn Numbers MD   On: 07/05/2020 01:24   CT  ABDOMEN PELVIS W CONTRAST  Result Date: 07/05/2020 CLINICAL DATA:  Abdominal pain EXAM: CT ABDOMEN AND PELVIS WITH CONTRAST TECHNIQUE: Multidetector CT imaging of the abdomen and pelvis was performed using the standard protocol  following bolus administration of intravenous contrast. CONTRAST:  80mL OMNIPAQUE IOHEXOL 300 MG/ML  SOLN COMPARISON:  None. FINDINGS: LOWER CHEST: Normal. HEPATOBILIARY: Normal hepatic contours. No intra- or extrahepatic biliary dilatation. Normal gallbladder. PANCREAS: Normal pancreas. No ductal dilatation or peripancreatic fluid collection. SPLEEN: Normal. ADRENALS/URINARY TRACT: The adrenal glands are normal. There is an abnormal enhancement pattern of the right kidney. There is periureteral stranding along the right ureter. There is diffuse thickening of the urinary bladder wall. STOMACH/BOWEL: There is no hiatal hernia. Normal duodenal course and caliber. No small bowel dilatation or inflammation. No focal colonic abnormality. Normal appendix. VASCULAR/LYMPHATIC: Normal course and caliber of the major abdominal vessels. No abdominal or pelvic lymphadenopathy. REPRODUCTIVE: Normal uterus. No adnexal mass. MUSCULOSKELETAL. No bony spinal canal stenosis or focal osseous abnormality. Lumbar levoscoliosis. OTHER: None. IMPRESSION: Abnormal enhancement pattern of the right kidney with periureteral stranding and diffuse thickening of the urinary bladder wall, consistent with ascending urinary tract infection and pyelonephritis. Electronically Signed   By: Deatra RobinsonKevin  Herman M.D.   On: 07/05/2020 00:56    Procedures Procedures (including critical care time)  Medications Ordered in ED Medications  lactated ringers infusion (has no administration in time range)  metroNIDAZOLE (FLAGYL) IVPB 500 mg (500 mg Intravenous New Bag/Given 07/05/20 0108)  ketorolac (TORADOL) 30 MG/ML injection 30 mg (has no administration in time range)  acetaminophen (TYLENOL) tablet 650 mg (650 mg Oral Given 07/04/20 2307)  ibuprofen (ADVIL) tablet 600 mg (600 mg Oral Given 07/04/20 2307)  cefTRIAXone (ROCEPHIN) 2 g in sodium chloride 0.9 % 100 mL IVPB ( Intravenous Stopped 07/05/20 0021)  lactated ringers bolus 1,000 mL (0 mLs Intravenous  Stopped 07/05/20 0107)    And  lactated ringers bolus 500 mL (500 mLs Intravenous New Bag/Given 07/05/20 0109)    And  lactated ringers bolus 250 mL (250 mLs Intravenous New Bag/Given 07/05/20 0117)  fentaNYL (SUBLIMAZE) injection 50 mcg (50 mcg Intravenous Given 07/04/20 2332)  ondansetron (ZOFRAN) injection 4 mg (4 mg Intravenous Given 07/04/20 2332)  iohexol (OMNIPAQUE) 300 MG/ML solution 80 mL (80 mLs Intravenous Contrast Given 07/05/20 0034)    ED Course  I have reviewed the triage vital signs and the nursing notes.  Pertinent labs & imaging results that were available during my care of the patient were reviewed by me and considered in my medical decision making (see chart for details).    MDM Reviewed: nursing note and vitals Interpretation: labs, ECG, x-ray and CT scan (NACPD on CXR by me, no appy by me on CT.  elevated white count, normal lactate, urine is consistent with pyelonephritis ) Total time providing critical care: 75-105 minutes (code sepsis antibiotics and fluids initiated in the ed ). This excludes time spent performing separately reportable procedures and services. Consults: admitting MD  CRITICAL CARE Performed by: Nickalous Stingley K Akira Adelsberger-Rasch Total critical care time: 75 minutes Critical care time was exclusive of separately billable procedures and treating other patients. Critical care was necessary to treat or prevent imminent or life-threatening deterioration. Critical care was time spent personally by me on the following activities: development of treatment plan with patient and/or surrogate as well as nursing, discussions with consultants, evaluation of patient's response to treatment, examination of patient, obtaining history from patient or surrogate, ordering and performing treatments and interventions, ordering and  review of laboratory studies, ordering and review of radiographic studies, pulse oximetry and re-evaluation of patient's condition.  Final Clinical  Impression(s) / ED Diagnoses Final diagnoses:  Pyelonephritis  Dehydration    Admit to medicine for pyelonephritis with dehydration    Oran Dillenburg, MD 07/05/20 1031

## 2020-07-05 NOTE — Plan of Care (Signed)

## 2020-07-06 LAB — COMPREHENSIVE METABOLIC PANEL
ALT: 14 U/L (ref 0–44)
AST: 14 U/L — ABNORMAL LOW (ref 15–41)
Albumin: 2.5 g/dL — ABNORMAL LOW (ref 3.5–5.0)
Alkaline Phosphatase: 61 U/L (ref 38–126)
Anion gap: 8 (ref 5–15)
BUN: 5 mg/dL — ABNORMAL LOW (ref 6–20)
CO2: 22 mmol/L (ref 22–32)
Calcium: 8.4 mg/dL — ABNORMAL LOW (ref 8.9–10.3)
Chloride: 107 mmol/L (ref 98–111)
Creatinine, Ser: 0.59 mg/dL (ref 0.44–1.00)
GFR calc Af Amer: >60 mL/min (ref >60)
GFR calc non Af Amer: >60 mL/min (ref >60)
Glucose, Bld: 106 mg/dL — ABNORMAL HIGH (ref 70–99)
Potassium: 3.6 mmol/L (ref 3.5–5.1)
Sodium: 137 mmol/L (ref 135–145)
Total Bilirubin: 0.6 mg/dL (ref 0.3–1.2)
Total Protein: 5.2 g/dL — ABNORMAL LOW (ref 6.5–8.1)

## 2020-07-06 LAB — CBC
HCT: 28 % — ABNORMAL LOW (ref 36.0–46.0)
Hemoglobin: 8.9 g/dL — ABNORMAL LOW (ref 12.0–15.0)
MCH: 30.7 pg (ref 26.0–34.0)
MCHC: 31.8 g/dL (ref 30.0–36.0)
MCV: 96.6 fL (ref 80.0–100.0)
Platelets: 97 10*3/uL — ABNORMAL LOW (ref 150–400)
RBC: 2.9 MIL/uL — ABNORMAL LOW (ref 3.87–5.11)
RDW: 13.4 % (ref 11.5–15.5)
WBC: 6.3 10*3/uL (ref 4.0–10.5)
nRBC: 0 % (ref 0.0–0.2)

## 2020-07-06 NOTE — Progress Notes (Addendum)
PROGRESS NOTE    Chelsea Hammond  BCW:[id number] DOB: May 23, 1996 DOA: 07/04/2020 PCP: Laurey Morale, MD   Brief Narrative:  HPI: Chelsea Hammond is a 24 y.o. female with medical history significant of bipolar disorder presented with complaints of right-sided abdominal pain which started approximately 5-6 days ago.  Describes it as a sharp and crampy pain that was constant.  Noted associated symptoms of subjective fever, nausea, vomiting, and constipation.  She had been shaking Aleve at home without any improvement in symptoms.  Patient reports that she has never had a urinary tract infection before in the past.  Denied having any blood in stool or urine.  That she has a yellowish discoloration of her face that is new.   ED Course: Upon admission into the emergency department patient was seen to be febrile up to 103.3 F, pulse up to 145, respirations up to 24, blood pressure 86/54-115/77, and O2 saturation maintained.  Labs revealed WBC 11.9, sodium 130, potassium 3.4, creatinine 0.7, calcium 8.7, and lactic acid 1.2.  Urine pregnancy was negative.  Urinalysis was positive for large hemoglobin, ketones, moderate leukocytes, positive nitrites, many bacteria, 21-50 squamous epithelial cells, and greater than 50 WBCs.  CT scan showed abnormal enhancement around the right kidney with periureteral stranding and diffuse bladder wall thickening.  Sepsis protocol has been initiated with blood and urine cultures.  Patient received 1.75 L of lactated Ringer's, Tylenol, pain medications, Rocephin, and metronidazole.  Patient accepted to a MedSurg bed at [facility].  COVID-19 screening was negative.  Assessment & Plan:   Principal Problem:   Sepsis ([place]) Active Problems:   Pyelonephritis   Anemia   Thrombocytopenia (H[facility])   Hypokalemia   Hyponatremia   Bipolar disorder (HCC)   Sepsis secondary to acute right pyelonephritis: She met sepsis criteria based on febrile up to 103.3 F with  tachycardia, tachypnea, and WBC 11.9.  Lactic acid was noted to be reassuring at 1.2.  Urinalysis was positive for signs of infection.  Imaging studies revealed concern of pyelonephritis of the right kidney with inflammation of the bladder concerning for UTI and appendix was noted to appear normal.  Sepsis protocol was initiated and patient had been started on empiric antibiotics.  She feels slightly better still with some right lower quadrant pain but better than yesterday.  Afebrile and no other complaint.  Tolerating diet.  Urine culture growing multiple organisms.  Blood culture negative thus far.  Continue IV Rocephin for now and tailor antibiotics accordingly.  Hypokalemia: Resolved.  Hyponatremia : Likely due to sepsis.  Resolved.  Hypocalcemia: Mild.  Acute thrombocytopenia: Came in with 141 and now down to 97.  Likely due to sepsis.  Hopefully this will improve.  No signs of bleeding.  Monitor daily.    Bipolar disorder: Patient on Abilify 5 mg nightly -Continue Abilify  DVT prophylaxis: enoxaparin (LOVENOX) injection 40 mg Start: 07/05/20 1400   Code Status: Full Code  Family Communication: Mother present at bedside.  Plan of care discussed with patient in length with patient and mother and they verbalized understanding and agreed with it.  Status is: Inpatient  Remains inpatient appropriate because:Inpatient level of care appropriate due to severity of illness   Dispo: The patient is from: Home              Anticipated d/c is to: Home              Anticipated d/c date is: 1 day  Patient currently is not medically stable to d/c.        Estimated body mass index is 20.63 kg/m as calculated from the following:   Height as of this encounter: _0  (1.651 m).   Weight as of this encounter: 56.2 kg.      Nutritional status:               Consultants:   None  Procedures:   None  Antimicrobials:  Anti-infectives (From admission, onward)    Start     Dose/Rate Route Frequency Ordered Stop   07/05/20 2200  cefTRIAXone (ROCEPHIN) 1 g in sodium chloride 0.9 % 100 mL IVPB        1 g 200 mL/hr over 30 Minutes Intravenous Every 24 hours 07/05/20 0811     07/04/20 2330  cefTRIAXone (ROCEPHIN) 2 g in sodium chloride 0.9 % 100 mL IVPB        2 g 200 mL/hr over 30 Minutes Intravenous  Once 07/04/20 2321 07/05/20 0021   07/04/20 2330  metroNIDAZOLE (FLAGYL) IVPB 500 mg        500 mg 100 mL/hr over 60 Minutes Intravenous  Once 07/04/20 2321 07/05/20 0218         Subjective: Seen and examined.  Still with right lower quadrant pain.  Better than yesterday.  No other complaint.  Objective: Vitals:   07/05/20 1711 07/05/20 1922 07/06/20 0500 07/06/20 0752  BP: (!) 94/58 (!) 132/94 126/81 102/76  Pulse: 95 (!) 108 98 71  Resp: _1 Temp: 98.2 F (36.8 C) 98.6 F (37 C) 98.1 F (36.7 C) 98.2 F (36.8 C)  TempSrc: Oral Oral Oral Oral  SpO2: 94%  100% 100%  Weight:      Height:        Intake/Output Summary (Last 24 hours) at 07/06/2020 1057 Last data filed at 07/06/2020 0900 Gross per 24 hour  Intake 220 ml  Output --  Net 220 ml   Filed Weights   07/04/20 2250  Weight: 56.2 kg    Examination:  General exam: Appears calm and comfortable  Respiratory system: Clear to auscultation. Respiratory effort normal. Cardiovascular system: S1 & S2 heard, RRR. No JVD, murmurs, rubs, gallops or clicks. No pedal edema. Gastrointestinal system: Abdomen is nondistended, soft and periumbilical and right lower quadrant tenderness. No organomegaly or masses felt. Normal bowel sounds heard.  Right CVA tenderness Central nervous system: Alert and oriented. No focal neurological deficits. Extremities: Symmetric 5 x 5 power. Skin: No rashes, lesions or ulcers Psychiatry: Judgement and insight appear normal. Mood & affect appropriate.    Data Reviewed: I have personally reviewed following labs and imaging studies  CBC: Recent  Labs  Lab 07/04/20 2259 07/05/20 1118 07/06/20 0533  WBC 11.9* 8.1 6.3  NEUTROABS 10.4*  --   --   HGB 12.1 9.8* 8.9*  HCT 37.5 30.1* 28.0*  MCV 95.4 95.6 96.6  PLT 141* 103* 97*   Basic Metabolic Panel: Recent Labs  Lab 07/04/20 2259 07/05/20 1118 07/06/20 0533  NA 130* 137 137  K 3.4* 3.6 3.6  CL 98 108 107  CO2 20* 21* 22  GLUCOSE 127* 133* 106*  BUN 8 <5* 5*  CREATININE 0.70 0.59 0.59  CALCIUM 8.7* 8.6* 8.4*   GFR: Estimated Creatinine Clearance: 97.9 mL/min (by C-G formula based on SCr of 0.59 mg/dL). Liver Function Tests: Recent Labs  Lab 07/04/20 2259 07/06/20 0533  AST 19 14*  ALT 19 14  ALKPHOS 61 61  BILITOT 0.7 0.6  PROT 7.8 5.2*  ALBUMIN 4.2 2.5*   No results for input(s): LIPASE, AMYLASE in the last 168 hours. No results for input(s): AMMONIA in the last 168 hours. Coagulation Profile: Recent Labs  Lab 07/04/20 2320  INR 1.1   Cardiac Enzymes: No results for input(s): CKTOTAL, CKMB, CKMBINDEX, TROPONINI in the last 168 hours. BNP (last 3 results) No results for input(s): PROBNP in the last 8760 hours. HbA1C: No results for input(s): HGBA1C in the last 72 hours. CBG: No results for input(s): GLUCAP in the last 168 hours. Lipid Profile: No results for input(s): CHOL, HDL, LDLCALC, TRIG, CHOLHDL, LDLDIRECT in the last 72 hours. Thyroid Function Tests: No results for input(s): TSH, T4TOTAL, FREET4, T3FREE, THYROIDAB in the last 72 hours. Anemia Panel: No results for input(s): VITAMINB12, FOLATE, FERRITIN, TIBC, IRON, RETICCTPCT in the last 72 hours. Sepsis Labs: Recent Labs  Lab 07/04/20 2259  LATICACIDVEN 1.2    Recent Results (from the past 240 hour(s))  Blood Culture (routine x 2)     Status: None (Preliminary result)   Collection Time: 07/04/20 10:59 PM   Specimen: BLOOD RIGHT ARM  Result Value Ref Range Status   Specimen Description   Final    BLOOD RIGHT ARM Performed at [facility], [address] [place], [place] [address]    Special Requests   Final    BOTTLES DRAWN AEROBIC AND ANAEROBIC Blood Culture adequate volume Performed at [facility], [address] [place], [place] [address]    Culture   Final    NO GROWTH 1 DAY Performed at [facility], [place] [address], [place], [place] [address]    Report Status PENDING  Incomplete  Blood Culture (routine x 2)     Status: None (Preliminary result)   Collection Time: 07/04/20 10:59 PM   Specimen: BLOOD LEFT ARM  Result Value Ref Range Status   Specimen Description   Final    BLOOD LEFT ARM Performed at [facility], [address] [place], [place] [address]    Special Requests   Final    BOTTLES DRAWN AEROBIC AND ANAEROBIC Blood Culture adequate volume Performed at [facility], [address] [place], [place] [address]    Culture   Final    NO GROWTH 1 DAY Performed at [facility], [place] [address] [place], [place] [address]    Report Status PENDING  Incomplete  SARS Coronavirus 2 by RT PCR (hospital order, performed in [facility] hospital lab) Nasopharyngeal Nasopharyngeal Swab     Status: None   Collection Time: 07/04/20 11:46 PM   Specimen: Nasopharyngeal Swab  Result Value Ref Range Status   SARS Coronavirus 2 NEGATIVE NEGATIVE Final    Comment: (NOTE) SARS-CoV-2 target nucleic acids are NOT DETECTED.  The SARS-CoV-2 RNA is generally detectable in upper and lower respiratory specimens during the acute phase of infection. The lowest concentration of SARS-CoV-2 viral copies this assay can detect is 250 copies / mL. A negative result does not preclude SARS-CoV-2 infection and should not be used as the sole basis for treatment or other patient management decisions.  A negative result may occur with improper specimen collection / handling, submission of specimen other than nasopharyngeal swab, presence of viral mutation(s) within the areas targeted by this assay, and inadequate  number of viral copies (<250 copies / mL). A negative result must be  combined with clinical observations, patient history, and epidemiological information.  Fact Sheet for Patients:   StrictlyIdeas.no  Fact Sheet for Healthcare Providers: BankingDealers.co.za  This test is not yet approved or  cleared by the [place] FDA and has been authorized for detection and/or diagnosis of SARS-CoV-2 by FDA under an Emergency Use Authorization (EUA).  This EUA will remain in effect (meaning this test can be used) for the duration of the COVID-19 declaration under Section 564(b)(1) of the Act, 21 U.S.C. section 360bbb-3(b)(1), unless the authorization is terminated or revoked sooner.  Performed at [facility], [address] [place], [place] [address]   Urine culture     Status: Abnormal   Collection Time: 07/05/20 12:09 AM   Specimen: In/Out Cath Urine  Result Value Ref Range Status   Specimen Description   Final    IN/OUT CATH URINE Performed at [facility], [address] [place], [place] [address]    Special Requests   Final    NONE Performed at [facility], [address] [place], [place] [address]    Culture MULTIPLE SPECIES PRESENT, SUGGEST RECOLLECTION (A)  Final   Report Status 07/05/2020 FINAL  Final      Radiology Studies: DG Chest 2 View  Result Date: 07/05/2020 CLINICAL DATA:  Tachycardia, right lower quadrant abdominal pain EXAM: CHEST - 2 VIEW COMPARISON:  None. FINDINGS: Lungs are clear. No pneumothorax or pleural effusion. Cardiac size within normal limits. Pulmonary vascularity normal. 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 No acute bone abnormality. IMPRESSION: No active cardiopulmonary disease. Electronically Signed   By: Fidela Salisbury MD   On: 07/05/2020 01:24   CT ABDOMEN PELVIS W CONTRAST  Result Date:  07/05/2020 CLINICAL DATA:  Abdominal pain EXAM: CT ABDOMEN AND PELVIS WITH CONTRAST TECHNIQUE: Multidetector CT imaging of the abdomen and pelvis was performed using the standard protocol following bolus administration of intravenous contrast. CONTRAST:  86m OMNIPAQUE IOHEXOL 300 MG/ML  SOLN COMPARISON:  None. FINDINGS: LOWER CHEST: Normal. HEPATOBILIARY: Normal hepatic contours. No intra- or extrahepatic biliary dilatation. Normal gallbladder. PANCREAS: Normal pancreas. No ductal dilatation or peripancreatic fluid collection. SPLEEN: Normal. ADRENALS/URINARY TRACT: The adrenal glands are normal. There is an abnormal enhancement pattern of the right kidney. There is periureteral stranding along the right ureter. There is diffuse thickening of the urinary bladder wall. STOMACH/BOWEL: There is no hiatal hernia. Normal duodenal course and caliber. No small bowel dilatation or inflammation. No focal colonic abnormality. Normal appendix. VASCULAR/LYMPHATIC: Normal course and caliber of the major abdominal vessels. No abdominal or pelvic lymphadenopathy. REPRODUCTIVE: Normal uterus. No adnexal mass. MUSCULOSKELETAL. No bony spinal canal stenosis or focal osseous abnormality. Lumbar levoscoliosis. OTHER: None. IMPRESSION: Abnormal enhancement pattern of the right kidney with periureteral stranding and diffuse thickening of the urinary bladder wall, consistent with ascending urinary tract infection and pyelonephritis. Electronically Signed   By: KUlyses JarredM.D.   On: 07/05/2020 00:56    Scheduled Meds: . ARIPiprazole  5 mg Oral QHS  . enoxaparin (LOVENOX) injection  40 mg Subcutaneous Q24H  . senna-docusate  1 tablet Oral BID  . sodium chloride flush  3 mL Intravenous Q12H   Continuous Infusions: . cefTRIAXone (ROCEPHIN)  IV 1 g (07/05/20 2116)     LOS: 1 day   Time spent: 33 minutes   RDarliss Cheney MD [facility]  07/06/2020, 10:57 AM   To contact the  attending provider between 7A-7P or the  covering provider during after hours 7P-7A, please log into the web site [email or web address].

## 2020-07-06 NOTE — Plan of Care (Signed)

## 2020-07-07 LAB — CBC WITH DIFFERENTIAL/PLATELET
Abs Immature Granulocytes: 0.02 10*3/uL (ref 0.00–0.07)
Basophils Absolute: 0 10*3/uL (ref 0.0–0.1)
Basophils Relative: 0 %
Eosinophils Absolute: 0 10*3/uL (ref 0.0–0.5)
Eosinophils Relative: 0 %
HCT: 34.4 % — ABNORMAL LOW (ref 36.0–46.0)
Hemoglobin: 11.4 g/dL — ABNORMAL LOW (ref 12.0–15.0)
Immature Granulocytes: 0 %
Lymphocytes Relative: 22 %
Lymphs Abs: 1.2 10*3/uL (ref 0.7–4.0)
MCH: 31.6 pg (ref 26.0–34.0)
MCHC: 33.1 g/dL (ref 30.0–36.0)
MCV: 95.3 fL (ref 80.0–100.0)
Monocytes Absolute: 0.5 10*3/uL (ref 0.1–1.0)
Monocytes Relative: 10 %
Neutro Abs: 3.7 10*3/uL (ref 1.7–7.7)
Neutrophils Relative %: 68 %
Platelets: 135 10*3/uL — ABNORMAL LOW (ref 150–400)
RBC: 3.61 MIL/uL — ABNORMAL LOW (ref 3.87–5.11)
RDW: 13.2 % (ref 11.5–15.5)
WBC: 5.5 10*3/uL (ref 4.0–10.5)
nRBC: 0 % (ref 0.0–0.2)

## 2020-07-07 LAB — COMPREHENSIVE METABOLIC PANEL
ALT: 18 U/L (ref 0–44)
AST: 19 U/L (ref 15–41)
Albumin: 3 g/dL — ABNORMAL LOW (ref 3.5–5.0)
Alkaline Phosphatase: 102 U/L (ref 38–126)
Anion gap: 9 (ref 5–15)
BUN: <5 mg/dL — ABNORMAL LOW (ref 6–20)
CO2: 25 mmol/L (ref 22–32)
Calcium: 8.5 mg/dL — ABNORMAL LOW (ref 8.9–10.3)
Chloride: 103 mmol/L (ref 98–111)
Creatinine, Ser: 0.56 mg/dL (ref 0.44–1.00)
GFR calc Af Amer: >60 mL/min (ref >60)
GFR calc non Af Amer: >60 mL/min (ref >60)
Glucose, Bld: 86 mg/dL (ref 70–99)
Potassium: 3.4 mmol/L — ABNORMAL LOW (ref 3.5–5.1)
Sodium: 137 mmol/L (ref 135–145)
Total Bilirubin: 0.5 mg/dL (ref 0.3–1.2)
Total Protein: 6.3 g/dL — ABNORMAL LOW (ref 6.5–8.1)

## 2020-07-07 LAB — MAGNESIUM: Magnesium: 1.8 mg/dL (ref 1.7–2.4)

## 2020-07-07 MED ORDER — POTASSIUM CHLORIDE CRYS ER 20 MEQ PO TBCR
40.0000 meq | EXTENDED_RELEASE_TABLET | Freq: Once | ORAL | Status: AC
  Administered 2020-07-07: 40 meq via ORAL
  Filled 2020-07-07: qty 2

## 2020-07-07 MED ORDER — LEVOFLOXACIN 750 MG PO TABS: 750.0000 mg | ORAL_TABLET | Freq: Every day | ORAL | 0 refills | Status: DC

## 2020-07-07 NOTE — Discharge Summary (Signed)
Physician Discharge Summary  Chelsea Hammond ZOX:[id number] DOB: [birth year] DOA: 07/04/2020  PCP: Nelwyn Salisbury, MD  Admit date: 07/04/2020 Discharge date: 07/07/2020  Admitted From: Home Disposition: Home  Recommendations for Outpatient Follow-up:  1. Follow up with PCP in 1-2 weeks 2. Please obtain BMP/CBC in one week 3. Please follow up with your PCP on the following pending results: Unresulted Labs (From admission, onward)         None       Home Health: None Equipment/Devices: None  Discharge Condition: Stable CODE STATUS: Full code Diet recommendation: Regular  Subjective: Seen and examined this morning grandmother at the bedside.  Still has right lower quadrant abdominal pain but much better than yesterday.  No nausea or vomiting.  Tolerating diet.  No fever.  Brief/Interim Summary: Chelsea Hammond a 24 y.o.femalewith medical history significant ofbipolar disorderpresented with complaints ofright-sided abdominal pain which started approximately 5-6days ago with associated symptoms of subjective fever, nausea, vomiting, and constipation. Denied having any blood in stool or urine.   Upon admission into the emergency department patient was seen to be febrile up to 103.3 F, pulse up to 145, respirations up to 24, blood pressure 86/54-115/77, and O2 saturation maintained. Labs revealed WBC 11.9, sodium 130, potassium 3.4, creatinine 0.7, calcium 8.7, and lactic acid 1.2. Urine pregnancy was negative. Urinalysis was positive for large hemoglobin, ketones, moderate leukocytes, positive nitrites, many bacteria, 21-50 squamous epithelial cells, and greater than 50 WBCs. CT scan showed abnormal enhancement around the right kidney with periureteral stranding and diffuse bladder wall thickening. Sepsis protocol was initiated with blood and urine cultures. Patient received 1.75 L of lactated Ringer's, Tylenol, pain medications, Rocephin, and metronidazole.  Patient was  admitted to [facility] for sepsis secondary to right-sided pyonephritis.  She was continued on Rocephin.  Over the course of last 2 days, patient has improved.  Her leukocytosis resolved.  Patient also had sepsis associated thrombocytopenia.  Her platelets have improved.  No signs of bleeding.  Her pain has improved significantly.  She has mild to moderate right CVA tenderness compared to severe which was yesterday.  She has not had any fever, chills or sweating.  She is tolerating diet without having any nausea or vomiting.  Unfortunately, her urine culture is growing multiple organisms consistent with contamination.  Luckily, her blood culture remains negative.  Lengthy discussion with her mother at the bedside and her father via FaceTime indicating that due to the fact that we do not have any single organism growing in urine culture, we would have to treat her empirically with the best known antibiotic to cover organisms for pyelonephritis.  I have chosen Levaquin 750 mg p.o. twice daily.  She has received 3 days of antibiotic.  I will prescribe her 5 more dose/days of oral Levaquin.  Patient, her parents, all are in agreement with the discharge plan today.  Her potassium is 3.4.  She received replacement for this.  She is a stable at the time of discharge.   Discharge Diagnoses:  Principal Problem:   Sepsis (HCC) Active Problems:   Pyelonephritis   Anemia   Thrombocytopenia (HCC)   Hypokalemia   Hyponatremia   Bipolar disorder (HCC)    Discharge Instructions   Allergies as of 07/07/2020      Reactions   Codeine    tachycardia      Medication List    STOP taking these medications   ARIPiprazole 5 MG tablet Commonly known as: ABILIFY  TAKE these medications   levofloxacin 750 MG tablet Commonly known as: Levaquin Take 1 tablet (750 mg total) by mouth daily for 5 days.       Follow-up Information    Nelwyn Salisbury, MD Follow up in 1 week(s).   Specialty: Family  Medicine Contact information: [address] [place] [address] [phone]              Allergies  Allergen Reactions  . Codeine     tachycardia    Consultations: None   Procedures/Studies: DG Chest 2 View  Result Date: 07/05/2020 CLINICAL DATA:  Tachycardia, right lower quadrant abdominal pain EXAM: CHEST - 2 VIEW COMPARISON:  None. FINDINGS: Lungs are clear. No pneumothorax or pleural effusion. Cardiac size within normal limits. Pulmonary vascularity normal. 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 No acute bone abnormality. IMPRESSION: No active cardiopulmonary disease. Electronically Signed   By: Helyn Numbers MD   On: 07/05/2020 01:24   CT ABDOMEN PELVIS W CONTRAST  Result Date: 07/05/2020 CLINICAL DATA:  Abdominal pain EXAM: CT ABDOMEN AND PELVIS WITH CONTRAST TECHNIQUE: Multidetector CT imaging of the abdomen and pelvis was performed using the standard protocol following bolus administration of intravenous contrast. CONTRAST:  54mL OMNIPAQUE IOHEXOL 300 MG/ML  SOLN COMPARISON:  None. FINDINGS: LOWER CHEST: Normal. HEPATOBILIARY: Normal hepatic contours. No intra- or extrahepatic biliary dilatation. Normal gallbladder. PANCREAS: Normal pancreas. No ductal dilatation or peripancreatic fluid collection. SPLEEN: Normal. ADRENALS/URINARY TRACT: The adrenal glands are normal. There is an abnormal enhancement pattern of the right kidney. There is periureteral stranding along the right ureter. There is diffuse thickening of the urinary bladder wall. STOMACH/BOWEL: There is no hiatal hernia. Normal duodenal course and caliber. No small bowel dilatation or inflammation. No focal colonic abnormality. Normal appendix. VASCULAR/LYMPHATIC: Normal course and caliber of the major abdominal vessels. No abdominal or pelvic lymphadenopathy. REPRODUCTIVE: Normal uterus. No adnexal mass. MUSCULOSKELETAL. No bony spinal canal stenosis or  focal osseous abnormality. Lumbar levoscoliosis. OTHER: None. IMPRESSION: Abnormal enhancement pattern of the right kidney with periureteral stranding and diffuse thickening of the urinary bladder wall, consistent with ascending urinary tract infection and pyelonephritis. Electronically Signed   By: Deatra Robinson M.D.   On: 07/05/2020 00:56      Discharge Exam: Vitals:   07/07/20 0350 07/07/20 0801  BP: 111/77 113/78  Pulse: (!) 101 93  Resp: 15 17  Temp: 98.5 F (36.9 C) 99 F (37.2 C)  SpO2: 98% 99%   Vitals:   07/06/20 1354 07/06/20 1956 07/07/20 0350 07/07/20 0801  BP: 108/78 116/76 111/77 113/78  Pulse: 82 96 (!) 101 93  Resp: 16 16 15 17   Temp: 98.1 F (36.7 C) 99.2 F (37.3 C) 98.5 F (36.9 C) 99 F (37.2 C)  TempSrc: Oral Oral Oral Oral  SpO2: 98% 97% 98% 99%  Weight:      Height:        General: Pt is alert, awake, not in acute distress Cardiovascular: RRR, S1/S2 +, no rubs, no gallops Respiratory: CTA bilaterally, no wheezing, no rhonchi Abdominal: Soft, NT, ND, bowel sounds +, positive right CVA tenderness Extremities: no edema, no cyanosis    The results of significant diagnostics from this hospitalization (including imaging, microbiology, ancillary and laboratory) are listed below for reference.     Microbiology: Recent Results (from the past 240 hour(s))  Blood Culture (routine x 2)     Status: None (Preliminary result)  Collection Time: 07/04/20 10:59 PM   Specimen: BLOOD RIGHT ARM  Result Value Ref Range Status   Specimen Description   Final    BLOOD RIGHT ARM Performed at [facility], [address] [place], [place] [address]    Special Requests   Final    BOTTLES DRAWN AEROBIC AND ANAEROBIC Blood Culture adequate volume Performed at [facility], [address] [place], [place] [address]    Culture   Final    NO GROWTH 2 DAYS Performed at [facility], [address], [place], [place] [address]    Report  Status PENDING  Incomplete  Blood Culture (routine x 2)     Status: None (Preliminary result)   Collection Time: 07/04/20 10:59 PM   Specimen: BLOOD LEFT ARM  Result Value Ref Range Status   Specimen Description   Final    BLOOD LEFT ARM Performed at [facility], [address] [place], [place] [address]    Special Requests   Final    BOTTLES DRAWN AEROBIC AND ANAEROBIC Blood Culture adequate volume Performed at [facility], [address] [place], [place] [address]    Culture   Final    NO GROWTH 2 DAYS Performed at [facility], [address], [place], [place] [address]    Report Status PENDING  Incomplete  SARS Coronavirus 2 by RT PCR (hospital order, performed in [facility] hospital lab) Nasopharyngeal Nasopharyngeal Swab     Status: None   Collection Time: 07/04/20 11:46 PM   Specimen: Nasopharyngeal Swab  Result Value Ref Range Status   SARS Coronavirus 2 NEGATIVE NEGATIVE Final    Comment: (NOTE) SARS-CoV-2 target nucleic acids are NOT DETECTED.  The SARS-CoV-2 RNA is generally detectable in upper and lower respiratory specimens during the acute phase of infection. The lowest concentration of SARS-CoV-2 viral copies this assay can detect is 250 copies / mL. A negative result does not preclude SARS-CoV-2 infection and should not be used as the sole basis for treatment or other patient management decisions.  A negative result may occur with improper specimen collection / handling, submission of specimen other than nasopharyngeal swab, presence of viral mutation(s) within the areas targeted by this assay, and inadequate number of viral copies (<250 copies / mL). A negative result must be combined with clinical observations, patient history, and epidemiological information.  Fact Sheet for Patients:   BoilerBrush.com.cy  Fact Sheet for Healthcare Providers: https://pope.com/  This test is  not yet approved or  cleared by the [place] FDA and has been authorized for detection and/or diagnosis of SARS-CoV-2 by FDA under an Emergency Use Authorization (EUA).  This EUA will remain in effect (meaning this test can be used) for the duration of the COVID-19 declaration under Section 564(b)(1) of the Act, 21 U.S.C. section 360bbb-3(b)(1), unless the authorization is terminated or revoked sooner.  Performed at [facility], [address] [place], [place] [address]   Urine culture     Status: Abnormal   Collection Time: 07/05/20 12:09 AM   Specimen: In/Out Cath Urine  Result Value Ref Range Status   Specimen Description   Final    IN/OUT CATH URINE Performed at [facility], [address] [place], [place] [address]    Special Requests   Final    NONE Performed at [facility], [address] [place],  [place] [address]    Culture MULTIPLE SPECIES PRESENT, SUGGEST RECOLLECTION (A)  Final   Report Status 07/05/2020 FINAL  Final     Labs: BNP (last 3 results) No results for input(s): BNP in the last 8760 hours. Basic Metabolic Panel: Recent Labs  Lab 07/04/20 2259 07/05/20 1118 07/06/20 0533 07/07/20 0635  NA 130* 137 137 137  K 3.4* 3.6 3.6 3.4*  CL 98 108 107 103  CO2 20* 21* 22 25  GLUCOSE 127* 133* 106* 86  BUN 8 <5* 5* <5*  CREATININE 0.70 0.59 0.59 0.56  CALCIUM 8.7* 8.6* 8.4* 8.5*  MG  --   --   --  1.8   Liver Function Tests: Recent Labs  Lab 07/04/20 2259 07/06/20 0533 07/07/20 0635  AST 19 14* 19  ALT 19 14 18   ALKPHOS 61 61 102  BILITOT 0.7 0.6 0.5  PROT 7.8 5.2* 6.3*  ALBUMIN 4.2 2.5* 3.0*   No results for input(s): LIPASE, AMYLASE in the last 168 hours. No results for input(s): AMMONIA in the last 168 hours. CBC: Recent Labs  Lab 07/04/20 2259 07/05/20 1118 07/06/20 0533 07/07/20 0635  WBC 11.9* 8.1 6.3 5.5  NEUTROABS 10.4*  --   --  3.7  HGB 12.1 9.8* 8.9* 11.4*  HCT 37.5 30.1* 28.0*  34.4*  MCV 95.4 95.6 96.6 95.3  PLT 141* 103* 97* 135*   Cardiac Enzymes: No results for input(s): CKTOTAL, CKMB, CKMBINDEX, TROPONINI in the last 168 hours. BNP: Invalid input(s): POCBNP CBG: No results for input(s): GLUCAP in the last 168 hours. D-Dimer No results for input(s): DDIMER in the last 72 hours. Hgb A1c No results for input(s): HGBA1C in the last 72 hours. Lipid Profile No results for input(s): CHOL, HDL, LDLCALC, TRIG, CHOLHDL, LDLDIRECT in the last 72 hours. Thyroid function studies No results for input(s): TSH, T4TOTAL, T3FREE, THYROIDAB in the last 72 hours.  Invalid input(s): FREET3 Anemia work up No results for input(s): VITAMINB12, FOLATE, FERRITIN, TIBC, IRON, RETICCTPCT in the last 72 hours. Urinalysis    Component Value Date/Time   COLORURINE YELLOW 07/05/2020 0009   APPEARANCEUR TURBID (A) 07/05/2020 0009   LABSPEC 1.025 07/05/2020 0009   PHURINE 6.0 07/05/2020 0009   GLUCOSEU NEGATIVE 07/05/2020 0009   HGBUR LARGE (A) 07/05/2020 0009   BILIRUBINUR NEGATIVE 07/05/2020 0009   KETONESUR >80 (A) 07/05/2020 0009   PROTEINUR 100 (A) 07/05/2020 0009   NITRITE POSITIVE (A) 07/05/2020 0009   LEUKOCYTESUR MODERATE (A) 07/05/2020 0009   Sepsis Labs Invalid input(s): PROCALCITONIN,  WBC,  LACTICIDVEN Microbiology Recent Results (from the past 240 hour(s))  Blood Culture (routine x 2)     Status: None (Preliminary result)   Collection Time: 07/04/20 10:59 PM   Specimen: BLOOD RIGHT ARM  Result Value Ref Range Status   Specimen Description   Final    BLOOD RIGHT ARM Performed at [facility], [address] [place], [place]    Special Requests   Final    BOTTLES DRAWN AEROBIC AND ANAEROBIC Blood Culture adequate volume Performed at [facility], [address] [place], [place]    Culture   Final    NO GROWTH 2 DAYS Performed at [facility], [address], [place], [place]    Report Status  PENDING  Incomplete  Blood Culture (routine x 2)     Status: None (Preliminary result)   Collection Time: 07/04/20 10:59 PM   Specimen:  BLOOD LEFT ARM  Result Value Ref Range Status   Specimen Description   Final    BLOOD LEFT ARM Performed at [facility], [address] [place], [place] [address]    Special Requests   Final    BOTTLES DRAWN AEROBIC AND ANAEROBIC Blood Culture adequate volume Performed at [facility] [place] [facility], [address] [place], [place] [address]    Culture   Final    NO GROWTH 2 DAYS Performed at [facility], [address] [place], [place] [address]    Report Status PENDING  Incomplete  SARS Coronavirus 2 by RT PCR (hospital order, performed in [facility] hospital lab) Nasopharyngeal Nasopharyngeal Swab     Status: None   Collection Time: 07/04/20 11:46 PM   Specimen: Nasopharyngeal Swab  Result Value Ref Range Status   SARS Coronavirus 2 NEGATIVE NEGATIVE Final    Comment: (NOTE) SARS-CoV-2 target nucleic acids are NOT DETECTED.  The SARS-CoV-2 RNA is generally detectable in upper and lower respiratory specimens during the acute phase of infection. The lowest concentration of SARS-CoV-2 viral copies this assay can detect is 250 copies / mL. A negative result does not preclude SARS-CoV-2 infection and should not be used as the sole basis for treatment or other patient management decisions.  A negative result may occur with improper specimen collection / handling, submission of specimen other than nasopharyngeal swab, presence of viral mutation(s) within the areas targeted by this assay, and inadequate number of viral copies (<250 copies / mL). A negative result must be combined with clinical observations, patient history, and epidemiological information.  Fact Sheet for Patients:   BoilerBrush.com.cyhttps://www.fda.gov/media/136312/download  Fact Sheet for Healthcare Providers: https://pope.com/https://www.fda.gov/media/136313/download  This test is not yet  approved or  cleared by the [place] FDA and has been authorized for detection and/or diagnosis of SARS-CoV-2 by FDA under an Emergency Use Authorization (EUA).  This EUA will remain in effect (meaning this test can be used) for the duration of the COVID-19 declaration under Section 564(b)(1) of the Act, 21 U.S.C. section 360bbb-3(b)(1), unless the authorization is terminated or revoked sooner.  Performed at [facility] [place] [facility], [address] [place], [place] [address]   Urine culture     Status: Abnormal   Collection Time: 07/05/20 12:09 AM   Specimen: In/Out Cath Urine  Result Value Ref Range Status   Specimen Description   Final    IN/OUT CATH URINE Performed at [facility] [place], [address] [place], [place] [address]    Special Requests   Final    NONE Performed at [facility], [address] [place], [place] [address]    Culture MULTIPLE SPECIES PRESENT, SUGGEST RECOLLECTION (A)  Final   Report Status 07/05/2020 FINAL  Final     Time coordinating discharge: Over 30 minutes  SIGNED:   Hughie Clossavi Jalicia Roszak, MD  [facility] 07/07/2020, 12:44 PM  If 7PM-7AM, please contact night-coverage [email or web address]

## 2020-07-07 NOTE — Progress Notes (Signed)
Dr. Jacqulyn Bath at bedside discussing patient's care and plan of care with patient, mom and dad (via facetime).

## 2020-07-07 NOTE — Discharge Instructions (Signed)
Pyelonephritis, Adult  Pyelonephritis is an infection that occurs in the kidney. The kidneys are organs that help clean the blood by moving waste out of the blood and into the pee (urine). This infection can happen quickly, or it can last for a long time. In most cases, it clears up with treatment and does not cause other problems. What are the causes? This condition may be caused by:  Germs (bacteria) going from the bladder up to the kidney. This may happen after having a bladder infection.  Germs going from the blood to the kidney. What increases the risk? This condition is more likely to develop in:  Pregnant women.  Older people.  People who have any of these conditions: ? Diabetes. ? Inflammation of the prostate gland (prostatitis), in males. ? Kidney stones or bladder stones. ? Other problems with the kidney or the parts of your body that carry pee from the kidneys to the bladder (ureters). ? Cancer.  People who have a small, thin tube (catheter) placed in the bladder.  People who are sexually active.  Women who use a medicine that kills sperm (spermicide) to prevent pregnancy.  People who have had a prior urinary tract infection (UTI). What are the signs or symptoms? Symptoms of this condition include:  Peeing often.  A strong urge to pee right away.  Burning or stinging when peeing.  Belly pain.  Back pain.  Pain in the side (flank area).  Fever or chills.  Blood in the pee, or dark pee.  Feeling sick to your stomach (nauseous) or throwing up (vomiting). How is this treated? This condition may be treated by:  Taking antibiotic medicines by mouth (orally).  Drinking enough fluids. If the infection is bad, you may need to stay in the hospital. You may be given antibiotics and fluids that are put directly into a vein through an IV tube. In some cases, other treatments may be needed. Follow these instructions at home: Medicines  Take your antibiotic  medicine as told by your doctor. Do not stop taking the antibiotic even if you start to feel better.  Take over-the-counter and prescription medicines only as told by your doctor. General instructions   Drink enough fluid to keep your pee pale yellow.  Avoid caffeine, tea, and carbonated drinks.  Pee (urinate) often. Avoid holding in pee for long periods of time.  Pee before and after sex.  After pooping (having a bowel movement), women should wipe from front to back. Use each tissue only once.  Keep all follow-up visits as told by your doctor. This is important. Contact a doctor if:  You do not feel better after 2 days.  Your symptoms get worse.  You have a fever. Get help right away if:  You cannot take your medicine or drink fluids as told.  You have chills and shaking.  You throw up.  You have very bad pain in your side or back.  You feel very weak or you pass out (faint). Summary  Pyelonephritis is an infection that occurs in the kidney.  In most cases, this infection clears up with treatment and does not cause other problems.  Take your antibiotic medicine as told by your doctor. Do not stop taking the antibiotic even if you start to feel better.  Drink enough fluid to keep your pee pale yellow. This information is not intended to replace advice given to you by your health care provider. Make sure you discuss any questions you have with   your health care provider. Document Revised: 09/03/2018 Document Reviewed: 09/03/2018 Elsevier Patient Education  2020 Elsevier Inc.  

## 2020-07-07 NOTE — Plan of Care (Signed)

## 2020-07-08 ENCOUNTER — Inpatient Hospital Stay (HOSPITAL_COMMUNITY)
Admission: EM | Admit: 2020-07-08 | Discharge: 2020-07-10 | DRG: 689 | Disposition: A | Discharge status: Home / Self Care | Payer: BC Managed Care – PPO | Attending: Internal Medicine | Admitting: Internal Medicine

## 2020-07-08 ENCOUNTER — Other Ambulatory Visit: Payer: Self-pay

## 2020-07-08 ENCOUNTER — Encounter (HOSPITAL_COMMUNITY): Payer: Self-pay

## 2020-07-08 DIAGNOSIS — R7881 Bacteremia: Secondary | ICD-10-CM

## 2020-07-08 DIAGNOSIS — N12 Tubulo-interstitial nephritis, not specified as acute or chronic: Secondary | ICD-10-CM | POA: Diagnosis not present

## 2020-07-08 DIAGNOSIS — Z8249 Family history of ischemic heart disease and other diseases of the circulatory system: Secondary | ICD-10-CM

## 2020-07-08 DIAGNOSIS — U071 COVID-19: Secondary | ICD-10-CM | POA: Diagnosis present

## 2020-07-08 DIAGNOSIS — B9620 Unspecified Escherichia coli [E. coli] as the cause of diseases classified elsewhere: Secondary | ICD-10-CM

## 2020-07-08 DIAGNOSIS — R0602 Shortness of breath: Secondary | ICD-10-CM

## 2020-07-08 DIAGNOSIS — K5900 Constipation, unspecified: Secondary | ICD-10-CM | POA: Diagnosis present

## 2020-07-08 DIAGNOSIS — F319 Bipolar disorder, unspecified: Secondary | ICD-10-CM | POA: Diagnosis present

## 2020-07-08 HISTORY — DX: Bacteremia: R78.81

## 2020-07-08 HISTORY — DX: Unspecified Escherichia coli (E. coli) as the cause of diseases classified elsewhere: B96.20

## 2020-07-08 LAB — URINALYSIS, MICROSCOPIC (REFLEX): RBC / HPF: >50 RBC/hpf (ref 0–5)

## 2020-07-08 LAB — BLOOD CULTURE ID PANEL (REFLEXED) - BCID2

## 2020-07-08 LAB — CBC
HCT: 38.7 % (ref 36.0–46.0)
Hemoglobin: 12.5 g/dL (ref 12.0–15.0)
MCH: 31.1 pg (ref 26.0–34.0)
MCHC: 32.3 g/dL (ref 30.0–36.0)
MCV: 96.3 fL (ref 80.0–100.0)
Platelets: 194 10*3/uL (ref 150–400)
RBC: 4.02 MIL/uL (ref 3.87–5.11)
RDW: 13.2 % (ref 11.5–15.5)
WBC: 4.2 10*3/uL (ref 4.0–10.5)
nRBC: 0 % (ref 0.0–0.2)

## 2020-07-08 LAB — URINALYSIS, ROUTINE W REFLEX MICROSCOPIC

## 2020-07-08 LAB — SARS CORONAVIRUS 2 BY RT PCR (HOSPITAL ORDER, PERFORMED IN ~~LOC~~ HOSPITAL LAB): SARS Coronavirus 2: POSITIVE — AB

## 2020-07-08 LAB — COMPREHENSIVE METABOLIC PANEL
ALT: 35 U/L (ref 0–44)
AST: 41 U/L (ref 15–41)
Albumin: 3.6 g/dL (ref 3.5–5.0)
Alkaline Phosphatase: 222 U/L — ABNORMAL HIGH (ref 38–126)
Anion gap: 10 (ref 5–15)
BUN: <5 mg/dL — ABNORMAL LOW (ref 6–20)
CO2: 22 mmol/L (ref 22–32)
Calcium: 9 mg/dL (ref 8.9–10.3)
Chloride: 107 mmol/L (ref 98–111)
Creatinine, Ser: 0.54 mg/dL (ref 0.44–1.00)
GFR calc Af Amer: >60 mL/min (ref >60)
GFR calc non Af Amer: >60 mL/min (ref >60)
Glucose, Bld: 92 mg/dL (ref 70–99)
Potassium: 3.8 mmol/L (ref 3.5–5.1)
Sodium: 139 mmol/L (ref 135–145)
Total Bilirubin: 0.1 mg/dL — ABNORMAL LOW (ref 0.3–1.2)
Total Protein: 7.6 g/dL (ref 6.5–8.1)

## 2020-07-08 LAB — I-STAT BETA HCG BLOOD, ED (MC, WL, AP ONLY): I-stat hCG, quantitative: <5 m[IU]/mL (ref <5)

## 2020-07-08 LAB — LIPASE, BLOOD: Lipase: 28 U/L (ref 11–51)

## 2020-07-08 MED ORDER — ONDANSETRON 4 MG PO TBDP
4.0000 mg | ORAL_TABLET | Freq: Once | ORAL | Status: AC
  Administered 2020-07-08: 4 mg via ORAL
  Filled 2020-07-08: qty 1

## 2020-07-08 MED ORDER — ONDANSETRON HCL 4 MG/2ML IJ SOLN: 4.0000 mg | Freq: Four times a day (QID) | INTRAMUSCULAR | Status: DC | PRN

## 2020-07-08 MED ORDER — ONDANSETRON HCL 4 MG PO TABS: 4.0000 mg | ORAL_TABLET | Freq: Four times a day (QID) | ORAL | Status: DC | PRN

## 2020-07-08 MED ORDER — SENNOSIDES-DOCUSATE SODIUM 8.6-50 MG PO TABS
1.0000 | ORAL_TABLET | Freq: Every day | ORAL | Status: DC
  Filled 2020-07-08: qty 1

## 2020-07-08 MED ORDER — FAMOTIDINE IN NACL 20-0.9 MG/50ML-% IV SOLN
20.0000 mg | Freq: Once | INTRAVENOUS | Status: AC
  Administered 2020-07-08: 20 mg via INTRAVENOUS
  Filled 2020-07-08: qty 50

## 2020-07-08 MED ORDER — ENOXAPARIN SODIUM 40 MG/0.4ML ~~LOC~~ SOLN
40.0000 mg | SUBCUTANEOUS | Status: DC
  Administered 2020-07-08 – 2020-07-09 (×2): 40 mg via SUBCUTANEOUS
  Filled 2020-07-08 (×2): qty 0.4

## 2020-07-08 MED ORDER — ACETAMINOPHEN 325 MG PO TABS: 650.0000 mg | ORAL_TABLET | Freq: Four times a day (QID) | ORAL | Status: DC | PRN

## 2020-07-08 MED ORDER — SODIUM CHLORIDE 0.9 % IV SOLN: INTRAVENOUS | Status: DC

## 2020-07-08 MED ORDER — MAGNESIUM HYDROXIDE 400 MG/5ML PO SUSP
15.0000 mL | Freq: Every day | ORAL | Status: DC | PRN
  Filled 2020-07-08: qty 30

## 2020-07-08 MED ORDER — SODIUM CHLORIDE 0.9 % IV SOLN
2.0000 g | INTRAVENOUS | Status: DC
  Administered 2020-07-09: 2 g via INTRAVENOUS
  Filled 2020-07-08 (×2): qty 20

## 2020-07-08 MED ORDER — SODIUM CHLORIDE 0.9 % IV SOLN
2.0000 g | Freq: Once | INTRAVENOUS | Status: AC
  Administered 2020-07-08: 2 g via INTRAVENOUS
  Filled 2020-07-08: qty 20

## 2020-07-08 MED ORDER — SODIUM CHLORIDE 0.9 % IV BOLUS
2000.0000 mL | Freq: Once | INTRAVENOUS | Status: AC
  Administered 2020-07-08: 2000 mL via INTRAVENOUS

## 2020-07-08 MED ORDER — ACETAMINOPHEN 650 MG RE SUPP: 650.0000 mg | Freq: Four times a day (QID) | RECTAL | Status: DC | PRN

## 2020-07-08 NOTE — ED Provider Notes (Signed)
Assisting with triage in the ED waiting room.  Patient returned for not feeling well after discharge after inpatient treatment for pyelonephritis.  No fevers or chills.  Inpatient blood cultures positive for Enterobacter and E. Coli.  ID consultation .  Frederik Schmidt, MD   Jimmye Norman, MD 07/08/20 [phone]

## 2020-07-08 NOTE — ED Triage Notes (Signed)
Pt presents with nausea and vomiting starting this am. Pt reports she was just d/c from upstairs for pyelonephritis. Pt reports she was dc home with abx only

## 2020-07-08 NOTE — ED Provider Notes (Signed)
[facility] EMERGENCY DEPARTMENT Provider Note   CSN: [id number] Arrival date & time: 07/08/20  0900     History Chief Complaint  Patient presents with  . Nausea  . Emesis    Chelsea Hammond is a 24 y.o. female.  Pt discharged from hospital yesterday.  Pt admitted for pyelonephritis.  Pt took Levoquin today and vomitted afterwards.  Pt not able to eat or drink.    The history is provided by the patient.  Emesis Severity:  Moderate Duration:  3 days Timing:  Intermittent Progression:  Worsening Recent urination:  Normal Relieved by:  Nothing Worsened by:  Nothing Ineffective treatments:  None tried Associated symptoms: no abdominal pain   Risk factors: no alcohol use        History reviewed. No pertinent past medical history.  Patient Active Problem List   Diagnosis Date Noted  . Pyelonephritis 07/05/2020  . Anemia 07/05/2020  . Thrombocytopenia (HCC) 07/05/2020  . Hypokalemia 07/05/2020  . Hyponatremia 07/05/2020  . Bipolar disorder (HCC) 07/05/2020  . Sepsis (HCC) 07/05/2020    Past Surgical History:  Procedure Laterality Date  . ADENOIDECTOMY    . TONSILLECTOMY       OB History   No obstetric history on file.     History reviewed. No pertinent family history.  Social History   Tobacco Use  . Smoking status: Never Smoker  . Smokeless tobacco: Never Used  Substance Use Topics  . Alcohol use: No    Alcohol/week: 0.0 standard drinks  . Drug use: No    Home Medications Prior to Admission medications   Medication Sig Start Date End Date Taking? Authorizing Provider  levofloxacin (LEVAQUIN) 750 MG tablet Take 1 tablet (750 mg total) by mouth daily for 5 days. 07/07/20 07/12/20  Hughie Closs, MD    Allergies    Codeine  Review of Systems   Review of Systems  Gastrointestinal: Positive for vomiting. Negative for abdominal pain.  All other systems reviewed and are negative.   Physical Exam Updated Vital Signs BP (!)  127/94   Pulse 99   Temp 99.1 F (37.3 C) (Oral)   Resp 14   LMP  (LMP Unknown)   SpO2 98%   Physical Exam Vitals and nursing note reviewed.  Constitutional:      Appearance: She is well-developed.  HENT:     Head: Normocephalic.     Mouth/Throat:     Mouth: Mucous membranes are moist.  Cardiovascular:     Rate and Rhythm: Tachycardia present.  Pulmonary:     Effort: Pulmonary effort is normal.  Abdominal:     General: There is no distension.  Musculoskeletal:        General: Normal range of motion.     Cervical back: Normal range of motion.  Neurological:     Mental Status: She is alert and oriented to person, place, and time.  Psychiatric:        Mood and Affect: Mood normal.     ED Results / Procedures / Treatments   Labs (all labs ordered are listed, but only abnormal results are displayed) Labs Reviewed  COMPREHENSIVE METABOLIC PANEL - Abnormal; Notable for the following components:      Result Value   BUN <5 (*)    Alkaline Phosphatase 222 (*)    Total Bilirubin 0.1 (*)    All other components within normal limits  URINALYSIS, ROUTINE W REFLEX MICROSCOPIC - Abnormal; Notable for the following components:   Color,  Urine RED (*)    APPearance TURBID (*)    Glucose, UA   (*)    Value: TEST NOT REPORTED DUE TO COLOR INTERFERENCE OF URINE PIGMENT   Hgb urine dipstick   (*)    Value: TEST NOT REPORTED DUE TO COLOR INTERFERENCE OF URINE PIGMENT   Bilirubin Urine   (*)    Value: TEST NOT REPORTED DUE TO COLOR INTERFERENCE OF URINE PIGMENT   Ketones, ur   (*)    Value: TEST NOT REPORTED DUE TO COLOR INTERFERENCE OF URINE PIGMENT   Protein, ur   (*)    Value: TEST NOT REPORTED DUE TO COLOR INTERFERENCE OF URINE PIGMENT   Nitrite   (*)    Value: TEST NOT REPORTED DUE TO COLOR INTERFERENCE OF URINE PIGMENT   Leukocytes,Ua   (*)    Value: TEST NOT REPORTED DUE TO COLOR INTERFERENCE OF URINE PIGMENT   All other components within normal limits  URINALYSIS,  MICROSCOPIC (REFLEX) - Abnormal; Notable for the following components:   Bacteria, UA FEW (*)    All other components within normal limits  SARS CORONAVIRUS 2 BY RT PCR (HOSPITAL ORDER, PERFORMED IN [facility])  URINE CULTURE  CULTURE, BLOOD (ROUTINE X 2)  CULTURE, BLOOD (ROUTINE X 2)  LIPASE, BLOOD  CBC  I-STAT BETA HCG BLOOD, ED ([facility], [facility], [facility] ONLY)    EKG None  Radiology No results found.  Procedures Procedures (including critical care time)  Medications Ordered in ED Medications  sodium chloride 0.9 % bolus 2,000 mL (has no administration in time range)  cefTRIAXone (ROCEPHIN) 2 g in sodium chloride 0.9 % 100 mL IVPB (has no administration in time range)  ondansetron (ZOFRAN-ODT) disintegrating tablet 4 mg (4 mg Oral Given 07/08/20 0915)    ED Course  I have reviewed the triage vital signs and the nursing notes.  Pertinent labs & imaging results that were available during my care of the patient were reviewed by me and considered in my medical decision making (see chart for details).    MDM Rules/Calculators/A&P                          TKW:IOXBD cultures returned positive for ecoli.   I spoke to Dr. Drue Second who advised 2 grams of Rocephin IV. Blood cultures repeated.  I spoke with Hospitalist who will admit Final Clinical Impression(s) / ED Diagnoses Final diagnoses:  Pyelonephritis    Rx / DC Orders ED Discharge Orders    None        Osie Cheeks 07/08/20 2009    Pollyann Savoy, MD 07/08/20 2128

## 2020-07-08 NOTE — ED Notes (Signed)
Date and time results received: 07/08/20 3:55 PM Test:blood cultures Critical Value: positive  Name of Provider Notified:Ray

## 2020-07-08 NOTE — Progress Notes (Signed)
PHARMACY - PHYSICIAN COMMUNICATION CRITICAL VALUE ALERT - BLOOD CULTURE IDENTIFICATION (BCID)  Blood cultures growing E. Coli in 1/4 cultures.   Currently on oral Levaquin BID for pyelonephritis. Was discharged today and had nausea and vomiting, presented to the ED.   Name of physician (or Provider) Contacted: ED Pharmacist  Changes to prescribed antibiotics required: D/c oral levofloxacin, initiate IV ceftriaxone 2g q24h  Lamar Sprinkles, PharmD PGY1 Pharmacy Resident 07/08/2020 3:40 PM

## 2020-07-08 NOTE — H&P (Signed)
History and Physical    Chelsea Hammond VZC:[id number] DOB: 1996-08-06 DOA: 07/08/2020  PCP: Laurey Morale, MD   Patient coming from: Home.   I have personally briefly reviewed patient's old medical records in [place]  Chief Complaint: Nausea and vomiting.  HPI: Chelsea Hammond is a 24 y.o. female with below past medical history who was discharged yesterday on levofloxacin 750 mg p.o. daily for acute right pyelonephritis after receiving 48 hours of therapy with IV ceftriaxone.  The patient states that she felt well after discharge until this morning when the nausea and 2 episodes of emesis along with fatigue/malaise recur.  Her appetite has been excellent through most of the day.  He stated this morning she also had palpitations and dizziness.  She denies headaches, fever, night sweats, but complains of chills.  No rhinorrhea, sore throat, dyspnea, wheezing or hemoptysis but has been having a mild dry cough on occasion.  No chest pain, palpitations, diaphoresis, PND, orthopnea or pitting edema of the lower extremities.  She denies abdominal pain, but has been constipated besides having nausea and emesis earlier today.  No melena or hematochezia.  She denies dysuria, frequency or hematuria.  No polyuria, polydipsia, polyphagia or blurred vision.  ED Course: Initial vital signs temperature 98.7 F, pulse 106, respirations 16, blood pressure 133/89 mmHg and O2 sat 100% on room air.  She received Zofran 4 mg disintegrating tablet, 2 g of ceftriaxone IVPB along with 2000 mL of NS bolus.  CBC showed a white count of 4.2, hemoglobin 12.5 g/dL and platelets 194.  CMP showed an elevated alk phosphatase 222.  BUN was less than 5 and total bilirubin 0.1 mg/dL.  The rest of the CMP values were normal.  Review of Systems: As per HPI otherwise all other systems reviewed and are negative.  Past Medical History:  Diagnosis Date  . Bipolar disorder ([place]) 07/05/2020   Past Surgical History:   Procedure Laterality Date  . ADENOIDECTOMY    . TONSILLECTOMY     Social History  reports that she has never smoked. She has never used smokeless tobacco. She reports that she does not drink alcohol and does not use drugs.  Allergies  Allergen Reactions  . Codeine     tachycardia   Family History  Problem Relation Age of Onset  . Hypertension Other    Prior to Admission medications   Medication Sig Start Date End Date Taking? Authorizing Provider  levofloxacin (LEVAQUIN) 750 MG tablet Take 1 tablet (750 mg total) by mouth daily for 5 days. 07/07/20 07/12/20  Darliss Cheney, MD   Physical Exam: Vitals:   07/08/20 1830 07/08/20 1845 07/08/20 1930 07/08/20 2000  BP: (!) 128/91 (!) 127/94 (!) 122/91 (!) 133/92  Pulse:  99 82 98  Resp:      Temp:      TempSrc:      SpO2: 95% 98% 97% 100%   Constitutional: NAD, calm, comfortable Eyes: PERRL, lids and conjunctivae normal ENMT: Mucous membranes are mildly dry. Posterior pharynx clear of any exudate or lesions. Neck: normal, supple, no masses, no thyromegaly Respiratory: clear to auscultation bilaterally, no wheezing, no crackles. Normal respiratory effort. No accessory muscle use.  Cardiovascular: Regular rate and rhythm, no murmurs / rubs / gallops. No extremity edema. 2+ pedal pulses. No carotid bruits.  Abdomen: Nondistended.  Soft, no tenderness, no masses palpated. No hepatosplenomegaly. Bowel sounds positive.  Musculoskeletal: no clubbing / cyanosis. No joint deformity upper and lower extremities. Good ROM, no  contractures. Normal muscle tone.  Skin: no rashes, lesions, ulcers. No induration Neurologic: CN 2-12 grossly intact. Sensation intact, DTR normal. Strength 5/5 in all 4.  Psychiatric: Normal judgment and insight. Alert and oriented x 3. Normal mood.   Labs on Admission: I have personally reviewed following labs and imaging studies  CBC: Recent Labs  Lab 07/04/20 2259 07/05/20 1118 07/06/20 0533 07/07/20 0635  07/08/20 0916  WBC 11.9* 8.1 6.3 5.5 4.2  NEUTROABS 10.4*  --   --  3.7  --   HGB 12.1 9.8* 8.9* 11.4* 12.5  HCT 37.5 30.1* 28.0* 34.4* 38.7  MCV 95.4 95.6 96.6 95.3 96.3  PLT 141* 103* 97* 135* 588   Basic Metabolic Panel: Recent Labs  Lab 07/04/20 2259 07/05/20 1118 07/06/20 0533 07/07/20 0635 07/08/20 0916  NA 130* 137 137 137 139  K 3.4* 3.6 3.6 3.4* 3.8  CL 98 108 107 103 107  CO2 20* 21* 22 25 22   GLUCOSE 127* 133* 106* 86 92  BUN 8 <5* 5* <5* <5*  CREATININE 0.70 0.59 0.59 0.56 0.54  CALCIUM 8.7* 8.6* 8.4* 8.5* 9.0  MG  --   --   --  1.8  --    GFR: Estimated Creatinine Clearance: 97.9 mL/min (by C-G formula based on SCr of 0.54 mg/dL).  Liver Function Tests: Recent Labs  Lab 07/04/20 2259 07/06/20 0533 07/07/20 0635 07/08/20 0916  AST 19 14* 19 41  ALT 19 14 18  35  ALKPHOS 61 61 102 222*  BILITOT 0.7 0.6 0.5 0.1*  PROT 7.8 5.2* 6.3* 7.6  ALBUMIN 4.2 2.5* 3.0* 3.6   Urine analysis:    Component Value Date/Time   COLORURINE RED (A) 07/08/2020 1025   APPEARANCEUR TURBID (A) 07/08/2020 1025   LABSPEC  07/08/2020 1025    TEST NOT REPORTED DUE TO COLOR INTERFERENCE OF URINE PIGMENT   PHURINE  07/08/2020 1025    TEST NOT REPORTED DUE TO COLOR INTERFERENCE OF URINE PIGMENT   GLUCOSEU (A) 07/08/2020 1025    TEST NOT REPORTED DUE TO COLOR INTERFERENCE OF URINE PIGMENT   HGBUR (A) 07/08/2020 1025    TEST NOT REPORTED DUE TO COLOR INTERFERENCE OF URINE PIGMENT   BILIRUBINUR (A) 07/08/2020 1025    TEST NOT REPORTED DUE TO COLOR INTERFERENCE OF URINE PIGMENT   KETONESUR (A) 07/08/2020 1025    TEST NOT REPORTED DUE TO COLOR INTERFERENCE OF URINE PIGMENT   PROTEINUR (A) 07/08/2020 1025    TEST NOT REPORTED DUE TO COLOR INTERFERENCE OF URINE PIGMENT   NITRITE (A) 07/08/2020 1025    TEST NOT REPORTED DUE TO COLOR INTERFERENCE OF URINE PIGMENT   LEUKOCYTESUR (A) 07/08/2020 1025    TEST NOT REPORTED DUE TO COLOR INTERFERENCE OF URINE PIGMENT    Radiological  Exams on Admission: No results found.  EKG: Independently reviewed.   Assessment/Plan Principal Problem:   Bacteremia due to Escherichia coli ED discussed this with ID on-call. Observation/MedSurg. Continue IV fluids. Continue ceftriaxone 2 g IV every 24 hours. Follow-up blood culture antibiotic sensitivity report.  Active Problems:   Constipation Avoid narcotics analgesics if possible. Begin Senokot 5 bedtime. MON as needed.    COVID-19 virus infection The patient has a subtle cough. She had both vaccine doses. Low risk for further deterioration. Continue clinical monitoring. MCA therapy if symptoms worsen.   DVT prophylaxis: Lovenox SQ. Code Status:   Full code. Family Communication:  Her mother Chelsea Hammond was present in the ED room. Disposition Plan:   Patient  is from:  Home.  Anticipated DC to:  Home.  Anticipated DC date:  07/09/2020.  Anticipated DC barriers: Clinical status and culture result.  Consults called: Admission status:  Observation/MedSurg.    Severity of Illness:Medium to high given E. coli bacteremia treated initially with ceftriaxone, then levofloxacin, but now showing sepsis toxic symptoms again with malaise, nausea and vomiting.  Reubin Milan MD [facility]  How to contact the [facility] Attending or Consulting provider [place] or covering provider during after hours [place], for this patient?   1. Check the care team in [facility] and look for a) attending/consulting [facility] provider listed and b) the [facility] team listed 2. Log into [email or web address] and use [place]'s universal password to access. If you do not have the password, please contact the hospital operator. 3. Locate the [facility] provider you are looking for under [facility] and page to a number that you can be directly reached. 4. If you still have difficulty reaching the provider, please page the [facility] (Director on Call) for the Hospitalists listed on amion for assistance.  07/08/2020, 8:37 PM    This document was prepared using Dragon voice recognition software and may contain some unintended transcription errors.

## 2020-07-09 ENCOUNTER — Observation Stay (HOSPITAL_COMMUNITY): Payer: BC Managed Care – PPO

## 2020-07-09 DIAGNOSIS — B9620 Unspecified Escherichia coli [E. coli] as the cause of diseases classified elsewhere: Secondary | ICD-10-CM | POA: Diagnosis present

## 2020-07-09 DIAGNOSIS — N12 Tubulo-interstitial nephritis, not specified as acute or chronic: Secondary | ICD-10-CM | POA: Diagnosis present

## 2020-07-09 DIAGNOSIS — R0602 Shortness of breath: Secondary | ICD-10-CM | POA: Diagnosis present

## 2020-07-09 DIAGNOSIS — Z8249 Family history of ischemic heart disease and other diseases of the circulatory system: Secondary | ICD-10-CM | POA: Diagnosis not present

## 2020-07-09 DIAGNOSIS — R7881 Bacteremia: Secondary | ICD-10-CM | POA: Diagnosis present

## 2020-07-09 DIAGNOSIS — F319 Bipolar disorder, unspecified: Secondary | ICD-10-CM | POA: Diagnosis present

## 2020-07-09 DIAGNOSIS — U071 COVID-19: Secondary | ICD-10-CM

## 2020-07-09 DIAGNOSIS — K5900 Constipation, unspecified: Secondary | ICD-10-CM | POA: Diagnosis present

## 2020-07-09 LAB — COMPREHENSIVE METABOLIC PANEL
ALT: 67 U/L — ABNORMAL HIGH (ref 0–44)
ALT: 117 U/L — ABNORMAL HIGH (ref 0–44)
AST: 100 U/L — ABNORMAL HIGH (ref 15–41)
AST: 160 U/L — ABNORMAL HIGH (ref 15–41)
Albumin: 3.2 g/dL — ABNORMAL LOW (ref 3.5–5.0)
Albumin: 3.8 g/dL (ref 3.5–5.0)
Alkaline Phosphatase: 199 U/L — ABNORMAL HIGH (ref 38–126)
Alkaline Phosphatase: 197 U/L — ABNORMAL HIGH (ref 38–126)
Anion gap: 12 (ref 5–15)
Anion gap: 10 (ref 5–15)
BUN: <5 mg/dL — ABNORMAL LOW (ref 6–20)
BUN: <5 mg/dL — ABNORMAL LOW (ref 6–20)
CO2: 17 mmol/L — ABNORMAL LOW (ref 22–32)
CO2: 23 mmol/L (ref 22–32)
Calcium: 8.5 mg/dL — ABNORMAL LOW (ref 8.9–10.3)
Calcium: 9 mg/dL (ref 8.9–10.3)
Chloride: 107 mmol/L (ref 98–111)
Chloride: 106 mmol/L (ref 98–111)
Creatinine, Ser: 0.52 mg/dL (ref 0.44–1.00)
Creatinine, Ser: 0.52 mg/dL (ref 0.44–1.00)
GFR calc Af Amer: >60 mL/min (ref >60)
GFR calc Af Amer: >60 mL/min (ref >60)
GFR calc non Af Amer: >60 mL/min (ref >60)
GFR calc non Af Amer: >60 mL/min (ref >60)
Glucose, Bld: 81 mg/dL (ref 70–99)
Glucose, Bld: 122 mg/dL — ABNORMAL HIGH (ref 70–99)
Potassium: 3.7 mmol/L (ref 3.5–5.1)
Potassium: 3.5 mmol/L (ref 3.5–5.1)
Sodium: 136 mmol/L (ref 135–145)
Sodium: 139 mmol/L (ref 135–145)
Total Bilirubin: 0.5 mg/dL (ref 0.3–1.2)
Total Bilirubin: 0.4 mg/dL (ref 0.3–1.2)
Total Protein: 6.5 g/dL (ref 6.5–8.1)
Total Protein: 7.8 g/dL (ref 6.5–8.1)

## 2020-07-09 LAB — CBC
HCT: 37.8 % (ref 36.0–46.0)
HCT: 38.5 % (ref 36.0–46.0)
Hemoglobin: 11.8 g/dL — ABNORMAL LOW (ref 12.0–15.0)
Hemoglobin: 12.4 g/dL (ref 12.0–15.0)
MCH: 31.1 pg (ref 26.0–34.0)
MCH: 30.6 pg (ref 26.0–34.0)
MCHC: 31.2 g/dL (ref 30.0–36.0)
MCHC: 32.2 g/dL (ref 30.0–36.0)
MCV: 99.5 fL (ref 80.0–100.0)
MCV: 95.1 fL (ref 80.0–100.0)
Platelets: 182 10*3/uL (ref 150–400)
Platelets: 233 10*3/uL (ref 150–400)
RBC: 3.8 MIL/uL — ABNORMAL LOW (ref 3.87–5.11)
RBC: 4.05 MIL/uL (ref 3.87–5.11)
RDW: 13.6 % (ref 11.5–15.5)
RDW: 13.5 % (ref 11.5–15.5)
WBC: 4.8 10*3/uL (ref 4.0–10.5)
WBC: 3.7 10*3/uL — ABNORMAL LOW (ref 4.0–10.5)
nRBC: 0 % (ref 0.0–0.2)
nRBC: 0 % (ref 0.0–0.2)

## 2020-07-09 LAB — CULTURE, BLOOD (ROUTINE X 2): Special Requests: ADEQUATE

## 2020-07-09 LAB — FERRITIN: Ferritin: 217 ng/mL (ref 11–307)

## 2020-07-09 LAB — URINE CULTURE: Culture: NO GROWTH

## 2020-07-09 LAB — C-REACTIVE PROTEIN: CRP: 2.6 mg/dL — ABNORMAL HIGH (ref <1.0)

## 2020-07-09 LAB — D-DIMER, QUANTITATIVE: D-Dimer, Quant: 1.29 ug/mL-FEU — ABNORMAL HIGH (ref 0.00–0.50)

## 2020-07-09 NOTE — ED Notes (Signed)
Empty bedpan patient is resting with call bell in reach

## 2020-07-09 NOTE — Progress Notes (Signed)
PROGRESS NOTE                                                                                                                                                                                                             Patient Demographics:    Chelsea Hammond, is a 24 y.o. female, DOB - 1996-04-16, MWU:[id number]  Outpatient Primary MD for the patient is Nelwyn Salisbury, MD   Admit date - 07/08/2020   LOS - 0  Chief Complaint  Patient presents with  . Nausea  . Emesis       Brief Narrative: Patient is a 24 y.o. female who was hospitalized from 8/22-8/25 for right-sided pyelonephritis-discharged home on levofloxacin-post discharge of blood cultures were positive for E. coli-came back to the emergency room on 8/26 for subjective fever, nausea, vomiting and cough.  She was found to have COVID-19 infection.  She was started on IV Rocephin and admitted to the hospitalist service.  See below for further details.  COVID-19 vaccinated status: Vaccinated  Significant Events: 8/22-8/25>> hospitalized for right-sided pyelonephritis-discharged on levofloxacin. 8/26>> Admit to [facility] for vomiting/subjective fever-cough-COVID-19 positive  Significant studies: 8/27>>Chest x-ray: No active disease  COVID-19 medications: None  Antibiotics: Rocephin: 8/26>>  Microbiology data: 8/26 >>blood culture: Pending 8/22>>: E. coli blood culture: E. coli  Procedures: None  Consults: None  DVT prophylaxis: enoxaparin (LOVENOX) injection 40 mg Start: 07/08/20 2000 SCDs Start: 07/08/20 1945    Subjective:    Chelsea Hammond today feels somewhat better-appears slightly anxious.  Tolerating clear liquid diet well.  Some dry cough but appears comfortable on room air.   Assessment  & Plan :   E. coli bacteremia with right-sided pyelonephritis: Afebrile overnight-looks nontoxic-no leukocytosis remain on IV Rocephin for another 24  hours-before considering transitioning to oral antimicrobial therapy.  She had looks vomiting till last night-diet being slowly advanced.  Not sure if the vomiting was from ongoing bacteremia or from newly diagnosed COVID-19 infection.  COVID-19 infection: Apart from cough/nausea/vomiting-really not that symptomatic.  Fully vaccinated.  Discussed with pharmacy-does not qualify for monoclonal antibody.  Plans are for supportive care and monitoring.  Was on clear liquid diet-we will advance as tolerated as her GI symptoms have improved.  Nausea/vomiting: Not sure if this is from ongoing bacteremia COVID-19 infection.  Abdominal exam is benign.  On clear liquid diet  this morning-advance as tolerated.  Continue as needed antiemetics.  Transaminitis: Mild-likely secondary to COVID-19.  Follow  Lab Results  Component Value Date   SARSCOV2NAA POSITIVE (A) 07/08/2020   SARSCOV2NAA NEGATIVE 07/04/2020    History of bipolar disorder-stable   ABG: No results found for: PHART, PCO2ART, PO2ART, HCO3, TCO2, ACIDBASEDEF, O2SAT  Vent Settings: N/A  Condition - Stable  Family Communication  :  Father updated over the phone on 8/27  Code Status :  Full Code  Diet :  Diet Order            Diet clear liquid Room service appropriate? Yes; Fluid consistency: Thin  Diet effective now                  Disposition Plan  :   Status is: Observation  The patient will require care spanning > 2 midnights and should be moved to inpatient because: IV treatments appropriate due to intensity of illness or inability to take PO  Dispo: The patient is from: Home              Anticipated d/c is to: Home              Anticipated d/c date is: 1 day              Patient currently is not medically stable to d/c.   Barriers to discharge: IV antibiotics for bacteremia-transition to oral antibiotics if able to advance diet to regular diet without recurrence of nausea/vomiting.  Antimicorbials  :     Anti-infectives (From admission, onward)   Start     Dose/Rate Route Frequency Ordered Stop   07/09/20 1730  cefTRIAXone (ROCEPHIN) 2 g in sodium chloride 0.9 % 100 mL IVPB        2 g 200 mL/hr over 30 Minutes Intravenous Every 24 hours 07/08/20 1958     07/08/20 1730  cefTRIAXone (ROCEPHIN) 2 g in sodium chloride 0.9 % 100 mL IVPB        2 g 200 mL/hr over 30 Minutes Intravenous  Once 07/08/20 1728 07/08/20 1957      Inpatient Medications  Scheduled Meds: . enoxaparin (LOVENOX) injection  40 mg Subcutaneous Q24H  . senna-docusate  1 tablet Oral QHS   Continuous Infusions: . sodium chloride 100 mL/hr at 07/08/20 2146  . cefTRIAXone (ROCEPHIN)  IV     PRN Meds:.acetaminophen **OR** acetaminophen, magnesium hydroxide, ondansetron **OR** ondansetron (ZOFRAN) IV   Time Spent in minutes  25  See all Orders from today for further details   Jeoffrey Massed M.D on 07/09/2020 at 2:06 PM  To page go to [email or web address] - use universal password  [facility] -  Office  [phone]    Objective:   Vitals:   07/09/20 0400 07/09/20 0600 07/09/20 0745 07/09/20 0800  BP: (!) 135/94 128/85  (!) 117/99  Pulse: 93 78 75   Resp: 16     Temp: 99 F (37.2 C)     TempSrc: Oral     SpO2: 97% 99% 97%     Wt Readings from Last 3 Encounters:  07/04/20 56.2 kg  06/02/16 55.3 kg (45 %, Z= -0.12)*  09/01/13 55.8 kg (61 %, Z= 0.29)*   * Growth percentiles are based on CDC (Girls, 2-20 Years) data.     Intake/Output Summary (Last 24 hours) at 07/09/2020 1406 Last data filed at 07/08/2020 2354 Gross per 24 hour  Intake 2100 ml  Output --  Net 2100 ml  Physical Exam Gen Exam:Alert awake-not in any distress HEENT:atraumatic, normocephalic Chest: B/L clear to auscultation anteriorly CVS:S1S2 regular Abdomen:soft non tender, non distended Extremities:no edema Neurology: Non focal Skin: no rash   Data Review:    CBC Recent Labs  Lab 07/04/20 2259 07/04/20 2259  07/05/20 1118 07/06/20 0533 07/07/20 0635 07/08/20 0916 07/09/20 0432  WBC 11.9*   < > 8.1 6.3 5.5 4.2 4.8  HGB 12.1   < > 9.8* 8.9* 11.4* 12.5 11.8*  HCT 37.5   < > 30.1* 28.0* 34.4* 38.7 37.8  PLT 141*   < > 103* 97* 135* 194 182  MCV 95.4   < > 95.6 96.6 95.3 96.3 99.5  MCH 30.8   < > 31.1 30.7 31.6 31.1 31.1  MCHC 32.3   < > 32.6 31.8 33.1 32.3 31.2  RDW 13.1   < > 13.4 13.4 13.2 13.2 13.6  LYMPHSABS 0.7  --   --   --  1.2  --   --   MONOABS 0.7  --   --   --  0.5  --   --   EOSABS 0.0  --   --   --  0.0  --   --   BASOSABS 0.0  --   --   --  0.0  --   --    < > = values in this interval not displayed.    Chemistries  Recent Labs  Lab 07/04/20 2259 07/04/20 2259 07/05/20 1118 07/06/20 0533 07/07/20 0635 07/08/20 0916 07/09/20 0432  NA 130*   < > 137 137 137 139 136  K 3.4*   < > 3.6 3.6 3.4* 3.8 3.7  CL 98   < > 108 107 103 107 107  CO2 20*   < > 21* 22 25 22  17*  GLUCOSE 127*   < > 133* 106* 86 92 81  BUN 8   < > <5* 5* <5* <5* <5*  CREATININE 0.70   < > 0.59 0.59 0.56 0.54 0.52  CALCIUM 8.7*   < > 8.6* 8.4* 8.5* 9.0 8.5*  MG  --   --   --   --  1.8  --   --   AST 19  --   --  14* 19 41 100*  ALT 19  --   --  14 18 35 67*  ALKPHOS 61  --   --  61 102 222* 199*  BILITOT 0.7  --   --  0.6 0.5 0.1* 0.5   < > = values in this interval not displayed.   ------------------------------------------------------------------------------------------------------------------ No results for input(s): CHOL, HDL, LDLCALC, TRIG, CHOLHDL, LDLDIRECT in the last 72 hours.  No results found for: HGBA1C ------------------------------------------------------------------------------------------------------------------ No results for input(s): TSH, T4TOTAL, T3FREE, THYROIDAB in the last 72 hours.  Invalid input(s): FREET3 ------------------------------------------------------------------------------------------------------------------ No results for input(s): VITAMINB12, FOLATE,  FERRITIN, TIBC, IRON, RETICCTPCT in the last 72 hours.  Coagulation profile Recent Labs  Lab 07/04/20 2320  INR 1.1    No results for input(s): DDIMER in the last 72 hours.  Cardiac Enzymes No results for input(s): CKMB, TROPONINI, MYOGLOBIN in the last 168 hours.  Invalid input(s): CK ------------------------------------------------------------------------------------------------------------------ No results found for: BNP  Micro Results Recent Results (from the past 240 hour(s))  Blood Culture (routine x 2)     Status: None (Preliminary result)   Collection Time: 07/04/20 10:59 PM   Specimen: BLOOD RIGHT ARM  Result Value Ref Range Status   Specimen Description  Final    BLOOD RIGHT ARM Performed at [facility], [address] [place], [place] [address]    Special Requests   Final    BOTTLES DRAWN AEROBIC AND ANAEROBIC Blood Culture adequate volume Performed at [facility], [address] [place], [place] [address]    Culture   Final    NO GROWTH 4 DAYS Performed at [facility], [address], [place], [place] [address]    Report Status PENDING  Incomplete  Blood Culture (routine x 2)     Status: Abnormal   Collection Time: 07/04/20 10:59 PM   Specimen: BLOOD LEFT ARM  Result Value Ref Range Status   Specimen Description   Final    BLOOD LEFT ARM Performed at [facility], [address] [place], [place] [address]    Special Requests   Final    BOTTLES DRAWN AEROBIC AND ANAEROBIC Blood Culture adequate volume Performed at [facility], [address] [place], [place] [address]    Culture  Setup Time   Final    GRAM NEGATIVE RODS AEROBIC BOTTLE ONLY Organism ID to follow CRITICAL RESULT CALLED TO, READ BACK BY AND VERIFIED WITH: Roselie Skinner PharmD 15:20 07/08/20 ([email or web address]) Performed at [facility], [address] [place], [place] [address]    Culture ESCHERICHIA COLI (A)  Final   Report Status  07/09/2020 FINAL  Final   Organism ID, Bacteria ESCHERICHIA COLI  Final      Susceptibility   Escherichia coli - MIC*    AMPICILLIN <=2 SENSITIVE Sensitive     CEFAZOLIN <=4 SENSITIVE Sensitive     CEFEPIME <=0.12 SENSITIVE Sensitive     CEFTAZIDIME <=1 SENSITIVE Sensitive     CEFTRIAXONE <=0.25 SENSITIVE Sensitive     CIPROFLOXACIN <=0.25 SENSITIVE Sensitive     GENTAMICIN <=1 SENSITIVE Sensitive     IMIPENEM <=0.25 SENSITIVE Sensitive     TRIMETH/SULFA <=20 SENSITIVE Sensitive     AMPICILLIN/SULBACTAM <=2 SENSITIVE Sensitive     PIP/TAZO <=4 SENSITIVE Sensitive     * ESCHERICHIA COLI  Blood Culture ID Panel (Reflexed)     Status: Abnormal   Collection Time: 07/04/20 10:59 PM  Result Value Ref Range Status   Enterococcus faecalis NOT DETECTED NOT DETECTED Final   Enterococcus Faecium NOT DETECTED NOT DETECTED Final   Listeria monocytogenes NOT DETECTED NOT DETECTED Final   Staphylococcus species NOT DETECTED NOT DETECTED Final   Staphylococcus aureus (BCID) NOT DETECTED NOT DETECTED Final   Staphylococcus epidermidis NOT DETECTED NOT DETECTED Final   Staphylococcus lugdunensis NOT DETECTED NOT DETECTED Final   Streptococcus species NOT DETECTED NOT DETECTED Final   Streptococcus agalactiae NOT DETECTED NOT DETECTED Final   Streptococcus pneumoniae NOT DETECTED NOT DETECTED Final   Streptococcus pyogenes NOT DETECTED NOT DETECTED Final   A.calcoaceticus-baumannii NOT DETECTED NOT DETECTED Final   Bacteroides fragilis NOT DETECTED NOT DETECTED Final   Enterobacterales DETECTED (A) NOT DETECTED Final    Comment: Enterobacterales represent a large order of gram negative bacteria, not a single organism. CRITICAL RESULT CALLED TO, READ BACK BY AND VERIFIED WITH: Roselie Skinner PharmD 15:20 07/08/20 ([email or web address])    Enterobacter cloacae complex NOT DETECTED NOT DETECTED Final   Escherichia coli DETECTED (A) NOT DETECTED Final    Comment: CRITICAL RESULT CALLED TO, READ BACK BY AND VERIFIED  WITH: Roselie Skinner PharmD 15:20 07/08/20 ([email or web address])    Klebsiella aerogenes NOT DETECTED NOT DETECTED  Final   Klebsiella oxytoca NOT DETECTED NOT DETECTED Final   Klebsiella pneumoniae NOT DETECTED NOT DETECTED Final   Proteus species NOT DETECTED NOT DETECTED Final   Salmonella species NOT DETECTED NOT DETECTED Final   Serratia marcescens NOT DETECTED NOT DETECTED Final   Haemophilus influenzae NOT DETECTED NOT DETECTED Final   Neisseria meningitidis NOT DETECTED NOT DETECTED Final   Pseudomonas aeruginosa NOT DETECTED NOT DETECTED Final   Stenotrophomonas maltophilia NOT DETECTED NOT DETECTED Final   Candida albicans NOT DETECTED NOT DETECTED Final   Candida auris NOT DETECTED NOT DETECTED Final   Candida glabrata NOT DETECTED NOT DETECTED Final   Candida krusei NOT DETECTED NOT DETECTED Final   Candida parapsilosis NOT DETECTED NOT DETECTED Final   Candida tropicalis NOT DETECTED NOT DETECTED Final   Cryptococcus neoformans/gattii NOT DETECTED NOT DETECTED Final   CTX-M ESBL NOT DETECTED NOT DETECTED Final   Carbapenem resistance IMP NOT DETECTED NOT DETECTED Final   Carbapenem resistance KPC NOT DETECTED NOT DETECTED Final   Carbapenem resistance NDM NOT DETECTED NOT DETECTED Final   Carbapenem resist OXA 48 LIKE NOT DETECTED NOT DETECTED Final   Carbapenem resistance VIM NOT DETECTED NOT DETECTED Final    Comment: Performed at [facility], [address], [place], [place] [address]  SARS Coronavirus 2 by RT PCR (hospital order, performed in [facility] hospital lab) Nasopharyngeal Nasopharyngeal Swab     Status: None   Collection Time: 07/04/20 11:46 PM   Specimen: Nasopharyngeal Swab  Result Value Ref Range Status   SARS Coronavirus 2 NEGATIVE NEGATIVE Final    Comment: (NOTE) SARS-CoV-2 target nucleic acids are NOT DETECTED.  The SARS-CoV-2 RNA is generally detectable in upper and lower respiratory specimens during the acute phase of infection. The lowest concentration of  SARS-CoV-2 viral copies this assay can detect is 250 copies / mL. A negative result does not preclude SARS-CoV-2 infection and should not be used as the sole basis for treatment or other patient management decisions.  A negative result may occur with improper specimen collection / handling, submission of specimen other than nasopharyngeal swab, presence of viral mutation(s) within the areas targeted by this assay, and inadequate number of viral copies (<250 copies / mL). A negative result must be combined with clinical observations, patient history, and epidemiological information.  Fact Sheet for Patients:   BoilerBrush.com.cy  Fact Sheet for Healthcare Providers: https://pope.com/  This test is not yet approved or  cleared by the [place] FDA and has been authorized for detection and/or diagnosis of SARS-CoV-2 by FDA under an Emergency Use Authorization (EUA).  This EUA will remain in effect (meaning this test can be used) for the duration of the COVID-19 declaration under Section 564(b)(1) of the Act, 21 U.S.C. section 360bbb-3(b)(1), unless the authorization is terminated or revoked sooner.  Performed at [facility], [address] [place], [place] [address]   Urine culture     Status: Abnormal   Collection Time: 07/05/20 12:09 AM   Specimen: In/Out Cath Urine  Result Value Ref Range Status   Specimen Description   Final    IN/OUT CATH URINE Performed at [facility], [address] [place], [place] [address]    Special Requests   Final    NONE Performed at [facility], [address] [place], [place] [address]    Culture MULTIPLE SPECIES PRESENT, SUGGEST RECOLLECTION (A)  Final   Report Status 07/05/2020 FINAL  Final  Blood culture (routine x 2)     Status: None (Preliminary result)   Collection Time: 07/08/20  5:29 PM   Specimen: BLOOD  Result Value Ref Range Status   Specimen  Description BLOOD SITE NOT SPECIFIED  Final   Special Requests   Final    BOTTLES DRAWN AEROBIC AND ANAEROBIC Blood Culture adequate volume   Culture   Final    NO GROWTH < 12 HOURS Performed at [facility], [address], [place], [place] [address]    Report Status PENDING  Incomplete  SARS Coronavirus 2 by RT PCR (hospital order, performed in [facility] hospital lab) Nasopharyngeal Nasopharyngeal Swab     Status: Abnormal   Collection Time: 07/08/20  5:34 PM   Specimen: Nasopharyngeal Swab  Result Value Ref Range Status   SARS Coronavirus 2 POSITIVE (A) NEGATIVE Final    Comment: RESULT CALLED TO, READ BACK BY AND VERIFIED WITH: P SIDBURY RN 07/08/20 AT 2027 SK (NOTE) SARS-CoV-2 target nucleic acids are DETECTED  SARS-CoV-2 RNA is generally detectable in upper respiratory specimens  during the acute phase of infection.  Positive results are indicative  of the presence of the identified virus, but do not rule out bacterial infection or co-infection with other pathogens not detected by the test.  Clinical correlation with patient history and  other diagnostic information is necessary to determine patient infection status.  The expected result is negative.  Fact Sheet for Patients:   BoilerBrush.com.cy   Fact Sheet for Healthcare Providers:   https://pope.com/    This test is not yet approved or cleared by the [place] FDA and  has been authorized for detection and/or diagnosis of SARS-CoV-2 by FDA under an Emergency Use Authorization (EUA).  This EUA will remain in effect (meaning this test  can be used) for the duration of  the COVID-19 declaration under Section 564(b)(1) of the Act, 21 U.S.C. section 360-bbb-3(b)(1), unless the authorization is terminated or revoked sooner.  Performed at [facility], [address] [place], [place] [address]     Radiology Reports DG Chest 2 View  Result Date:  07/05/2020 CLINICAL DATA:  Tachycardia, right lower quadrant abdominal pain EXAM: CHEST - 2 VIEW COMPARISON:  None. FINDINGS: Lungs are clear. No pneumothorax or pleural effusion. Cardiac size within normal limits. Pulmonary vascularity normal. 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 No acute bone abnormality. IMPRESSION: No active cardiopulmonary disease. Electronically Signed   By: Helyn Numbers MD   On: 07/05/2020 01:24   CT ABDOMEN PELVIS W CONTRAST  Result Date: 07/05/2020 CLINICAL DATA:  Abdominal pain EXAM: CT ABDOMEN AND PELVIS WITH CONTRAST TECHNIQUE: Multidetector CT imaging of the abdomen and pelvis was performed using the standard protocol following bolus administration of intravenous contrast. CONTRAST:  75mL OMNIPAQUE IOHEXOL 300 MG/ML  SOLN COMPARISON:  None. FINDINGS: LOWER CHEST: Normal. HEPATOBILIARY: Normal hepatic contours. No intra- or extrahepatic biliary dilatation. Normal gallbladder. PANCREAS: Normal pancreas. No ductal dilatation or peripancreatic fluid collection. SPLEEN: Normal. ADRENALS/URINARY TRACT: The adrenal glands are normal. There is an abnormal enhancement pattern of the right kidney. There is periureteral stranding along the right ureter. There is diffuse thickening of the urinary bladder wall. STOMACH/BOWEL: There is no hiatal hernia. Normal duodenal course and caliber. No small bowel dilatation or inflammation. No focal colonic abnormality. Normal appendix. VASCULAR/LYMPHATIC: Normal course and caliber of the major abdominal vessels. No abdominal or pelvic lymphadenopathy. REPRODUCTIVE:  Normal uterus. No adnexal mass. MUSCULOSKELETAL. No bony spinal canal stenosis or focal osseous abnormality. Lumbar levoscoliosis. OTHER: None. IMPRESSION: Abnormal enhancement pattern of the right kidney with periureteral stranding and diffuse thickening of the urinary bladder wall, consistent with ascending urinary tract infection and  pyelonephritis. Electronically Signed   By: Deatra Robinson M.D.   On: 07/05/2020 00:56   DG Chest Port 1V same Day  Result Date: 07/09/2020 CLINICAL DATA:  Shortness of breath. EXAM: PORTABLE CHEST 1 VIEW COMPARISON:  07/05/2020 FINDINGS: The heart size and mediastinal contours are within normal limits. Both lungs are clear. Thoracolumbar scoliosis noted. IMPRESSION: No active disease. Electronically Signed   By: Signa Kell M.D.   On: 07/09/2020 07:38

## 2020-07-09 NOTE — ED Notes (Signed)
Gave patient breakfast tray patient is resting with call bell in reach 

## 2020-07-09 NOTE — ED Notes (Signed)
Breakfast Ordered 

## 2020-07-09 NOTE — Progress Notes (Signed)
Pharmacy COVID-19 Monoclonal Antibody Screening  Chelsea Hammond was identified as being not hospitalized with symptoms from Covid-19 on admission but an incidental positive PCR has been documented.  The patient does not qualify for the use of monoclonal antibodies (mAB) for COVID-19 viral infection.  The patient was identified based on a positive COVID-19 PCR and not requiring the use of supplemental oxygen at this time.  This patient does not meet the FDA criteria for Emergency Use Authorization of casirivimab/imdevimab.  Has a (+) direct SARS-CoV-2 viral test result  Is NOT hospitalized due to COVID-19  Is within 10 days of symptom onset  Has at least one of the high risk factor(s) for progression to severe COVID-19 and/or hospitalization as defined in EUA.  Specific high risk criteria : None  Additionally: The patient has had a positive COVID-19 PCR in the last 90 days. Cycle count 17.4 (high burden of disease <32)  The patient is fully vaccinated against COVID-19.  The patient does not meet criteria for mAB administration due to being partially or fully vaccinated for COVID-19, asymptomatic, and does not meet high risk factors for the EUA.  This eligibility and indication for treatment was discussed with the patient's physician: Dr. Jerral Ralph  Plan: Based on the above discussion, it was decided that the patient will NOT receive a dose of mAB combination.   Patient needs to be on contact precautions.  Lamar Sprinkles, PharmD PGY1 Pharmacy Resident 07/09/2020 8:34 AM

## 2020-07-10 DIAGNOSIS — N12 Tubulo-interstitial nephritis, not specified as acute or chronic: Principal | ICD-10-CM

## 2020-07-10 LAB — COMPREHENSIVE METABOLIC PANEL
ALT: 124 U/L — ABNORMAL HIGH (ref 0–44)
AST: 143 U/L — ABNORMAL HIGH (ref 15–41)
Albumin: 3.4 g/dL — ABNORMAL LOW (ref 3.5–5.0)
Alkaline Phosphatase: 169 U/L — ABNORMAL HIGH (ref 38–126)
Anion gap: 10 (ref 5–15)
BUN: <5 mg/dL — ABNORMAL LOW (ref 6–20)
CO2: 23 mmol/L (ref 22–32)
Calcium: 8.8 mg/dL — ABNORMAL LOW (ref 8.9–10.3)
Chloride: 106 mmol/L (ref 98–111)
Creatinine, Ser: 0.4 mg/dL — ABNORMAL LOW (ref 0.44–1.00)
GFR calc Af Amer: >60 mL/min (ref >60)
GFR calc non Af Amer: >60 mL/min (ref >60)
Glucose, Bld: 102 mg/dL — ABNORMAL HIGH (ref 70–99)
Potassium: 3.2 mmol/L — ABNORMAL LOW (ref 3.5–5.1)
Sodium: 139 mmol/L (ref 135–145)
Total Bilirubin: 0.4 mg/dL (ref 0.3–1.2)
Total Protein: 7 g/dL (ref 6.5–8.1)

## 2020-07-10 LAB — CBC
HCT: 33.6 % — ABNORMAL LOW (ref 36.0–46.0)
Hemoglobin: 11.1 g/dL — ABNORMAL LOW (ref 12.0–15.0)
MCH: 30.7 pg (ref 26.0–34.0)
MCHC: 33 g/dL (ref 30.0–36.0)
MCV: 92.8 fL (ref 80.0–100.0)
Platelets: 222 10*3/uL (ref 150–400)
RBC: 3.62 MIL/uL — ABNORMAL LOW (ref 3.87–5.11)
RDW: 13.4 % (ref 11.5–15.5)
WBC: 4 10*3/uL (ref 4.0–10.5)
nRBC: 0 % (ref 0.0–0.2)

## 2020-07-10 LAB — CULTURE, BLOOD (ROUTINE X 2)
Culture: NO GROWTH
Special Requests: ADEQUATE

## 2020-07-10 LAB — C-REACTIVE PROTEIN: CRP: 1.6 mg/dL — ABNORMAL HIGH (ref <1.0)

## 2020-07-10 LAB — FERRITIN: Ferritin: 155 ng/mL (ref 11–307)

## 2020-07-10 LAB — D-DIMER, QUANTITATIVE: D-Dimer, Quant: 1.31 ug/mL-FEU — ABNORMAL HIGH (ref 0.00–0.50)

## 2020-07-10 MED ORDER — AMOXICILLIN 500 MG PO CAPS: 500.0000 mg | ORAL_CAPSULE | Freq: Three times a day (TID) | ORAL | 0 refills | Status: AC

## 2020-07-10 MED ORDER — POTASSIUM CHLORIDE CRYS ER 20 MEQ PO TBCR
40.0000 meq | EXTENDED_RELEASE_TABLET | Freq: Once | ORAL | Status: AC
  Administered 2020-07-10: 40 meq via ORAL
  Filled 2020-07-10: qty 2

## 2020-07-10 NOTE — Discharge Summary (Addendum)
PATIENT DETAILS Name: Chelsea Hammond Age: 24 y.o. Sex: female Date of Birth: 02/06/1996 MRN: [id number]. Admitting Physician: Maretta Bees, MD PCP:Fry, Tera Mater, MD  Admit Date: 07/08/2020 Discharge date: 07/10/2020  Recommendations for Outpatient Follow-up:  1. Follow up with PCP in 1-2 weeks 2. Please obtain CMP/CBC in one week 3. Please follow blood cultures until final  Admitted From:  Home  Disposition: Home   Home Health: No  Equipment/Devices: None  Discharge Condition: Stable  CODE STATUS: FULL CODE  Diet recommendation:  Diet Order            Diet regular Room service appropriate? Yes; Fluid consistency: Thin  Diet effective now           Diet general                  Brief Summary: See H&P, Labs, Consult and Test reports for all details in brief, Patient is a 24 y.o. female who was hospitalized from 8/22-8/25 for right-sided pyelonephritis-discharged home on levofloxacin-post discharge  blood cultures were positive for E. coli-came back to the emergency room on 8/26 for subjective fever, nausea, vomiting and cough.  She was found to have COVID-19 infection.  She was started on IV Rocephin and admitted to the hospitalist service.  See below for further details.  COVID-19 vaccinated status: Vaccinated  Significant Events: 8/22-8/25>> hospitalized for right-sided pyelonephritis-discharged on levofloxacin. 8/26>> Admit to [facility] for vomiting/subjective fever-cough-COVID-19 positive  Significant studies: 8/27>>Chest x-ray: No active disease  COVID-19 medications: None  Antibiotics: Rocephin: 8/26>> 8/28  Microbiology data: 8/26 >>blood culture:  Negative so far 8/22>>: E. coli blood culture: E. coli  Procedures: None  Consults: None   Brief Hospital Course: E. coli bacteremia with right-sided pyelonephritis:  Feels much better-she was just hospitalized for right-sided pyelonephritis-post discharge-her blood cultures  came back positive for E. coli. She presented back to the hospital with fever and nausea/vomiting-she was managed with IV Rocephin this hospitalization-she feels much better-remains afebrile-nausea/vomiting has resolved-she does not have CVA tenderness. Blood cultures positive for pansensitive E Coli- We will transition her to amoxicillin on discharge. Her PCP will need to follow pending blood cultures until final  Clinical features  NOT suggestive of sepsis-sepsis ruled out  COVID-19 Labs:  Recent Labs    07/09/20 1628 07/10/20 0119  DDIMER 1.29* 1.31*  FERRITIN 217 155  CRP 2.6* 1.6*    Lab Results  Component Value Date   SARSCOV2NAA POSITIVE (A) 07/08/2020   SARSCOV2NAA NEGATIVE 07/04/2020     COVID-19 infection: Apart from cough/nausea/vomiting-really not that symptomatic.  Fully vaccinated.   On room air. Discussed with pharmacy-she does not qualify for monoclonal antibody. She was just managed with supportive care. Diet was advanced. Patient TO continue self monitoring at home.  Nausea/vomiting: Resolved. Not sure if this is from ongoing bacteremia COVID-19 infection.  Abdominal exam is benign. Awaiting advancement in diet  Transaminitis: Mild-likely secondary to COVID-19.   PCP to repeat blood work at next visit.  History of bipolar disorder: Apparently takes Abilify at home.   Discharge Diagnoses:  Principal Problem:   Bacteremia due to Escherichia coli Active Problems:   Constipation   COVID-19 virus infection   E coli bacteremia   Discharge Instructions:    Person Under Monitoring Name: Chelsea Hammond  Location: [address] [place] [address]   Infection Prevention Recommendations for Individuals Confirmed to have, or Being Evaluated for, 2019 Novel Coronavirus (COVID-19) Infection Who Receive Care at Home  Individuals  who are confirmed to have, or are being evaluated for, COVID-19 should follow the prevention steps below until a  healthcare provider or local or state health department says they can return to normal activities.  Stay home except to get medical care You should restrict activities outside your home, except for getting medical care. Do not go to work, school, or public areas, and do not use public transportation or taxis.  Call ahead before visiting your doctor Before your medical appointment, call the healthcare provider and tell them that you have, or are being evaluated for, COVID-19 infection. This will help the healthcare provider's office take steps to keep other people from getting infected. Ask your healthcare provider to call the local or state health department.  Monitor your symptoms Seek prompt medical attention if your illness is worsening (e.g., difficulty breathing). Before going to your medical appointment, call the healthcare provider and tell them that you have, or are being evaluated for, COVID-19 infection. Ask your healthcare provider to call the local or state health department.  Wear a facemask You should wear a facemask that covers your nose and mouth when you are in the same room with other people and when you visit a healthcare provider. People who live with or visit you should also wear a facemask while they are in the same room with you.  Separate yourself from other people in your home As much as possible, you should stay in a different room from other people in your home. Also, you should use a separate bathroom, if available.  Avoid sharing household items You should not share dishes, drinking glasses, cups, eating utensils, towels, bedding, or other items with other people in your home. After using these items, you should wash them thoroughly with soap and water.  Cover your coughs and sneezes Cover your mouth and nose with a tissue when you cough or sneeze, or you can cough or sneeze into your sleeve. Throw used tissues in a lined trash can, and immediately wash  your hands with soap and water for at least 20 seconds or use an alcohol-based hand rub.  Wash your [employer] your hands often and thoroughly with soap and water for at least 20 seconds. You can use an alcohol-based hand sanitizer if soap and water are not available and if your hands are not visibly dirty. Avoid touching your eyes, nose, and mouth with unwashed hands.   Prevention Steps for Caregivers and Household Members of Individuals Confirmed to have, or Being Evaluated for, COVID-19 Infection Being Cared for in the Home  If you live with, or provide care at home for, a person confirmed to have, or being evaluated for, COVID-19 infection please follow these guidelines to prevent infection:  Follow healthcare provider's instructions Make sure that you understand and can help the patient follow any healthcare provider instructions for all care.  Provide for the patient's basic needs You should help the patient with basic needs in the home and provide support for getting groceries, prescriptions, and other personal needs.  Monitor the patient's symptoms If they are getting sicker, call his or her medical provider and tell them that the patient has, or is being evaluated for, COVID-19 infection. This will help the healthcare provider's office take steps to keep other people from getting infected. Ask the healthcare provider to call the local or state health department.  Limit the number of people who have contact with the patient  If possible, have only one caregiver for the  patient.  Other household members should stay in another home or place of residence. If this is not possible, they should stay  in another room, or be separated from the patient as much as possible. Use a separate bathroom, if available.  Restrict visitors who do not have an essential need to be in the home.  Keep older adults, very young children, and other sick people away from the patient Keep older  adults, very young children, and those who have compromised immune systems or chronic health conditions away from the patient. This includes people with chronic heart, lung, or kidney conditions, diabetes, and cancer.  Ensure good ventilation Make sure that shared spaces in the home have good air flow, such as from an air conditioner or an opened window, weather permitting.  Wash your hands often  Wash your hands often and thoroughly with soap and water for at least 20 seconds. You can use an alcohol based hand sanitizer if soap and water are not available and if your hands are not visibly dirty.  Avoid touching your eyes, nose, and mouth with unwashed hands.  Use disposable paper towels to dry your hands. If not available, use dedicated cloth towels and replace them when they become wet.  Wear a facemask and gloves  Wear a disposable facemask at all times in the room and gloves when you touch or have contact with the patient's blood, body fluids, and/or secretions or excretions, such as sweat, saliva, sputum, nasal mucus, vomit, urine, or feces.  Ensure the mask fits over your nose and mouth tightly, and do not touch it during use.  Throw out disposable facemasks and gloves after using them. Do not reuse.  Wash your hands immediately after removing your facemask and gloves.  If your personal clothing becomes contaminated, carefully remove clothing and launder. Wash your hands after handling contaminated clothing.  Place all used disposable facemasks, gloves, and other waste in a lined container before disposing them with other household waste.  Remove gloves and wash your hands immediately after handling these items.  Do not share dishes, glasses, or other household items with the patient  Avoid sharing household items. You should not share dishes, drinking glasses, cups, eating utensils, towels, bedding, or other items with a patient who is confirmed to have, or being evaluated for,  COVID-19 infection.  After the person uses these items, you should wash them thoroughly with soap and water.  Wash laundry thoroughly  Immediately remove and wash clothes or bedding that have blood, body fluids, and/or secretions or excretions, such as sweat, saliva, sputum, nasal mucus, vomit, urine, or feces, on them.  Wear gloves when handling laundry from the patient.  Read and follow directions on labels of laundry or clothing items and detergent. In general, wash and dry with the warmest temperatures recommended on the label.  Clean all areas the individual has used often  Clean all touchable surfaces, such as counters, tabletops, doorknobs, bathroom fixtures, toilets, phones, keyboards, tablets, and bedside tables, every day. Also, clean any surfaces that may have blood, body fluids, and/or secretions or excretions on them.  Wear gloves when cleaning surfaces the patient has come in contact with.  Use a diluted bleach solution (e.g., dilute bleach with 1 part bleach and 10 parts water) or a household disinfectant with a label that says EPA-registered for coronaviruses. To make a bleach solution at home, add 1 tablespoon of bleach to 1 quart (4 cups) of water. For a larger supply, add  cup of bleach to 1 gallon (16 cups) of water.  Read labels of cleaning products and follow recommendations provided on product labels. Labels contain instructions for safe and effective use of the cleaning product including precautions you should take when applying the product, such as wearing gloves or eye protection and making sure you have good ventilation during use of the product.  Remove gloves and wash hands immediately after cleaning.  Monitor yourself for signs and symptoms of illness Caregivers and household members are considered close contacts, should monitor their health, and will be asked to limit movement outside of the home to the extent possible. Follow the monitoring steps for close  contacts listed on the symptom monitoring form.   ? If you have additional questions, contact your local health department or call the epidemiologist on call at [phone] (available 24/7). ? This guidance is subject to change. For the most up-to-date guidance from CDC, please refer to their website: [employer]    Activity:  As tolerated   Discharge Instructions    Call MD for:  difficulty breathing, headache or visual disturbances   Complete by: As directed    Call MD for:  persistant nausea and vomiting   Complete by: As directed    Call MD for:  severe uncontrolled pain   Complete by: As directed    Diet general   Complete by: As directed    Discharge instructions   Complete by: As directed    Follow with Primary MD  Nelwyn Salisbury, MD in 1-2 weeks  Please ask your primary care practitioner to follow-up on pending blood cultures-they are negative so far.  Please ask your primary care practitioner to repeat a liver enzyme testing-they were slightly elevated due to COVID-19 infection.  2 weeks of isolation from the day of your first positive Covid test  Please continue to monitor your Covid symptoms closely-if you develop worsening shortness of breath-please seek immediate medical attention.  Please get a complete blood count and chemistry panel checked by your Primary MD at your next visit, and again as instructed by your Primary MD.  Get Medicines reviewed and adjusted: Please take all your medications with you for your next visit with your Primary MD  Laboratory/radiological data: Please request your Primary MD to go over all hospital tests and procedure/radiological results at the follow up, please ask your Primary MD to get all Hospital records sent to his/her office.  In some cases, they will be blood work, cultures and biopsy results pending at the time of your discharge. Please request that your  primary care M.D. follows up on these results.  Also Note the following: If you experience worsening of your admission symptoms, develop shortness of breath, life threatening emergency, suicidal or homicidal thoughts you must seek medical attention immediately by calling 911 or calling your MD immediately  if symptoms less severe.  You must read complete instructions/literature along with all the possible adverse reactions/side effects for all the Medicines you take and that have been prescribed to you. Take any new Medicines after you have completely understood and accpet all the possible adverse reactions/side effects.   Do not drive when taking Pain medications or sleeping medications (Benzodaizepines)  Do not take more than prescribed Pain, Sleep and Anxiety Medications. It is not advisable to combine anxiety,sleep and pain medications without talking with your primary care practitioner  Special Instructions: If you have smoked or chewed Tobacco  in the last 2 yrs please stop smoking, stop  any regular Alcohol  and or any Recreational drug use.  Wear Seat belts while driving.  Please note: You were cared for by a hospitalist during your hospital stay. Once you are discharged, your primary care physician will handle any further medical issues. Please note that NO REFILLS for any discharge medications will be authorized once you are discharged, as it is imperative that you return to your primary care physician (or establish a relationship with a primary care physician if you do not have one) for your post hospital discharge needs so that they can reassess your need for medications and monitor your lab values.   Increase activity slowly   Complete by: As directed      Allergies as of 07/10/2020      Reactions   Codeine    tachycardia      Medication List    STOP taking these medications   levofloxacin 750 MG tablet Commonly known as: Levaquin     TAKE these medications   amoxicillin  500 MG capsule Commonly known as: AMOXIL Take 1 capsule (500 mg total) by mouth 3 (three) times daily for 7 days.   ARIPiprazole 5 MG tablet Commonly known as: ABILIFY Take 5 mg by mouth at bedtime.       Follow-up Information    Nelwyn Salisbury, MD. Schedule an appointment as soon as possible for a visit in 1 week(s).   Specialty: Family Medicine Contact information: [address] [place] [address] [phone]              Allergies  Allergen Reactions  . Codeine     tachycardia     Other Procedures/Studies: DG Chest 2 View  Result Date: 07/05/2020 CLINICAL DATA:  Tachycardia, right lower quadrant abdominal pain EXAM: CHEST - 2 VIEW COMPARISON:  None. FINDINGS: Lungs are clear. No pneumothorax or pleural effusion. Cardiac size within normal limits. Pulmonary vascularity normal. 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 No acute bone abnormality. IMPRESSION: No active cardiopulmonary disease. Electronically Signed   By: Helyn Numbers MD   On: 07/05/2020 01:24   CT ABDOMEN PELVIS W CONTRAST  Result Date: 07/05/2020 CLINICAL DATA:  Abdominal pain EXAM: CT ABDOMEN AND PELVIS WITH CONTRAST TECHNIQUE: Multidetector CT imaging of the abdomen and pelvis was performed using the standard protocol following bolus administration of intravenous contrast. CONTRAST:  63mL OMNIPAQUE IOHEXOL 300 MG/ML  SOLN COMPARISON:  None. FINDINGS: LOWER CHEST: Normal. HEPATOBILIARY: Normal hepatic contours. No intra- or extrahepatic biliary dilatation. Normal gallbladder. PANCREAS: Normal pancreas. No ductal dilatation or peripancreatic fluid collection. SPLEEN: Normal. ADRENALS/URINARY TRACT: The adrenal glands are normal. There is an abnormal enhancement pattern of the right kidney. There is periureteral stranding along the right ureter. There is diffuse thickening of the urinary bladder wall. STOMACH/BOWEL: There is no hiatal hernia.  Normal duodenal course and caliber. No small bowel dilatation or inflammation. No focal colonic abnormality. Normal appendix. VASCULAR/LYMPHATIC: Normal course and caliber of the major abdominal vessels. No abdominal or pelvic lymphadenopathy. REPRODUCTIVE: Normal uterus. No adnexal mass. MUSCULOSKELETAL. No bony spinal canal stenosis or focal osseous abnormality. Lumbar levoscoliosis. OTHER: None. IMPRESSION: Abnormal enhancement pattern of the right kidney with periureteral stranding and diffuse thickening of the urinary bladder wall, consistent with ascending urinary tract infection and pyelonephritis. Electronically Signed   By: Deatra Robinson M.D.   On: 07/05/2020 00:56   DG Chest Port 1V same Day  Result Date: 07/09/2020 CLINICAL  DATA:  Shortness of breath. EXAM: PORTABLE CHEST 1 VIEW COMPARISON:  07/05/2020 FINDINGS: The heart size and mediastinal contours are within normal limits. Both lungs are clear. Thoracolumbar scoliosis noted. IMPRESSION: No active disease. Electronically Signed   By: Signa Kell M.D.   On: 07/09/2020 07:38     TODAY-DAY OF DISCHARGE:  Subjective:   Wilhemina Cash today has no headache,no chest abdominal pain,no new weakness tingling or numbness, feels much better wants to go home today.   Objective:   Blood pressure 122/84, pulse 80, temperature 97.9 F (36.6 C), resp. rate 19, SpO2 100 %.  Intake/Output Summary (Last 24 hours) at 07/10/2020 1052 Last data filed at 07/10/2020 0532 Gross per 24 hour  Intake 2100 ml  Output --  Net 2100 ml   There were no vitals filed for this visit.  Exam: Awake Alert, Oriented *3, No new F.N deficits, Normal affect Sac.AT,PERRAL Supple Neck,No JVD, No cervical lymphadenopathy appriciated.  Symmetrical Chest wall movement, Good air movement bilaterally, CTAB RRR,No Gallops,Rubs or new Murmurs, No Parasternal Heave +ve B.Sounds, Abd Soft, Non tender, No organomegaly appriciated, No rebound -guarding or rigidity. No  Cyanosis, Clubbing or edema, No new Rash or bruise   PERTINENT RADIOLOGIC STUDIES: DG Chest 2 View  Result Date: 07/05/2020 CLINICAL DATA:  Tachycardia, right lower quadrant abdominal pain EXAM: CHEST - 2 VIEW COMPARISON:  None. FINDINGS: Lungs are clear. No pneumothorax or pleural effusion. Cardiac size within normal limits. Pulmonary vascularity normal. 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 No acute bone abnormality. IMPRESSION: No active cardiopulmonary disease. Electronically Signed   By: Helyn Numbers MD   On: 07/05/2020 01:24   CT ABDOMEN PELVIS W CONTRAST  Result Date: 07/05/2020 CLINICAL DATA:  Abdominal pain EXAM: CT ABDOMEN AND PELVIS WITH CONTRAST TECHNIQUE: Multidetector CT imaging of the abdomen and pelvis was performed using the standard protocol following bolus administration of intravenous contrast. CONTRAST:  80mL OMNIPAQUE IOHEXOL 300 MG/ML  SOLN COMPARISON:  None. FINDINGS: LOWER CHEST: Normal. HEPATOBILIARY: Normal hepatic contours. No intra- or extrahepatic biliary dilatation. Normal gallbladder. PANCREAS: Normal pancreas. No ductal dilatation or peripancreatic fluid collection. SPLEEN: Normal. ADRENALS/URINARY TRACT: The adrenal glands are normal. There is an abnormal enhancement pattern of the right kidney. There is periureteral stranding along the right ureter. There is diffuse thickening of the urinary bladder wall. STOMACH/BOWEL: There is no hiatal hernia. Normal duodenal course and caliber. No small bowel dilatation or inflammation. No focal colonic abnormality. Normal appendix. VASCULAR/LYMPHATIC: Normal course and caliber of the major abdominal vessels. No abdominal or pelvic lymphadenopathy. REPRODUCTIVE: Normal uterus. No adnexal mass. MUSCULOSKELETAL. No bony spinal canal stenosis or focal osseous abnormality. Lumbar levoscoliosis. OTHER: None. IMPRESSION: Abnormal enhancement pattern of the right kidney with  periureteral stranding and diffuse thickening of the urinary bladder wall, consistent with ascending urinary tract infection and pyelonephritis. Electronically Signed   By: Deatra Robinson M.D.   On: 07/05/2020 00:56   DG Chest Port 1V same Day  Result Date: 07/09/2020 CLINICAL DATA:  Shortness of breath. EXAM: PORTABLE CHEST 1 VIEW COMPARISON:  07/05/2020 FINDINGS: The heart size and mediastinal contours are within normal limits. Both lungs are clear. Thoracolumbar scoliosis noted. IMPRESSION: No active disease. Electronically Signed   By: Signa Kell M.D.   On: 07/09/2020 07:38     PERTINENT LAB RESULTS: CBC: Recent Labs    07/09/20 1628 07/10/20 0119  WBC 3.7* 4.0  HGB 12.4 11.1*  HCT 38.5  33.6*  PLT 233 222   CMET CMP     Component Value Date/Time   NA 139 07/10/2020 0119   K 3.2 (L) 07/10/2020 0119   CL 106 07/10/2020 0119   CO2 23 07/10/2020 0119   GLUCOSE 102 (H) 07/10/2020 0119   BUN <5 (L) 07/10/2020 0119   CREATININE 0.40 (L) 07/10/2020 0119   CALCIUM 8.8 (L) 07/10/2020 0119   PROT 7.0 07/10/2020 0119   ALBUMIN 3.4 (L) 07/10/2020 0119   AST 143 (H) 07/10/2020 0119   ALT 124 (H) 07/10/2020 0119   ALKPHOS 169 (H) 07/10/2020 0119   BILITOT 0.4 07/10/2020 0119   GFRNONAA >60 07/10/2020 0119   GFRAA >60 07/10/2020 0119    GFR Estimated Creatinine Clearance: 97.9 mL/min (A) (by C-G formula based on SCr of 0.4 mg/dL (L)). Recent Labs    07/08/20 0916  LIPASE 28   No results for input(s): CKTOTAL, CKMB, CKMBINDEX, TROPONINI in the last 72 hours. Invalid input(s): POCBNP Recent Labs    07/09/20 1628 07/10/20 0119  DDIMER 1.29* 1.31*   No results for input(s): HGBA1C in the last 72 hours. No results for input(s): CHOL, HDL, LDLCALC, TRIG, CHOLHDL, LDLDIRECT in the last 72 hours. No results for input(s): TSH, T4TOTAL, T3FREE, THYROIDAB in the last 72 hours.  Invalid input(s): FREET3 Recent Labs    07/09/20 1628 07/10/20 0119  FERRITIN 217 155    Coags: No results for input(s): INR in the last 72 hours.  Invalid input(s): PT Microbiology: Recent Results (from the past 240 hour(s))  Blood Culture (routine x 2)     Status: None (Preliminary result)   Collection Time: 07/04/20 10:59 PM   Specimen: BLOOD RIGHT ARM  Result Value Ref Range Status   Specimen Description   Final    BLOOD RIGHT ARM Performed at [facility], [address] [place], [place] [address]    Special Requests   Final    BOTTLES DRAWN AEROBIC AND ANAEROBIC Blood Culture adequate volume Performed at [facility], [address] [place], [place] [address]    Culture   Final    NO GROWTH 4 DAYS Performed at [facility], [address], [place], [place] [address]    Report Status PENDING  Incomplete  Blood Culture (routine x 2)     Status: Abnormal   Collection Time: 07/04/20 10:59 PM   Specimen: BLOOD LEFT ARM  Result Value Ref Range Status   Specimen Description   Final    BLOOD LEFT ARM Performed at [facility], [address] [place], [place] [address]    Special Requests   Final    BOTTLES DRAWN AEROBIC AND ANAEROBIC Blood Culture adequate volume Performed at [facility], [address] [place], [place] [address]    Culture  Setup Time   Final    GRAM NEGATIVE RODS AEROBIC BOTTLE ONLY Organism ID to follow CRITICAL RESULT CALLED TO, READ BACK BY AND VERIFIED WITH: Roselie Skinner PharmD 15:20 07/08/20 ([email or web address]) Performed at [facility], [address] [place], [place] [address]    Culture ESCHERICHIA COLI (A)  Final   Report Status 07/09/2020 FINAL  Final   Organism ID, Bacteria ESCHERICHIA COLI  Final      Susceptibility   Escherichia coli - MIC*    AMPICILLIN <=2 SENSITIVE Sensitive     CEFAZOLIN <=4 SENSITIVE Sensitive     CEFEPIME <=0.12 SENSITIVE Sensitive  CEFTAZIDIME <=1 SENSITIVE Sensitive     CEFTRIAXONE <=0.25 SENSITIVE Sensitive     CIPROFLOXACIN <=0.25 SENSITIVE  Sensitive     GENTAMICIN <=1 SENSITIVE Sensitive     IMIPENEM <=0.25 SENSITIVE Sensitive     TRIMETH/SULFA <=20 SENSITIVE Sensitive     AMPICILLIN/SULBACTAM <=2 SENSITIVE Sensitive     PIP/TAZO <=4 SENSITIVE Sensitive     * ESCHERICHIA COLI  Blood Culture ID Panel (Reflexed)     Status: Abnormal   Collection Time: 07/04/20 10:59 PM  Result Value Ref Range Status   Enterococcus faecalis NOT DETECTED NOT DETECTED Final   Enterococcus Faecium NOT DETECTED NOT DETECTED Final   Listeria monocytogenes NOT DETECTED NOT DETECTED Final   Staphylococcus species NOT DETECTED NOT DETECTED Final   Staphylococcus aureus (BCID) NOT DETECTED NOT DETECTED Final   Staphylococcus epidermidis NOT DETECTED NOT DETECTED Final   Staphylococcus lugdunensis NOT DETECTED NOT DETECTED Final   Streptococcus species NOT DETECTED NOT DETECTED Final   Streptococcus agalactiae NOT DETECTED NOT DETECTED Final   Streptococcus pneumoniae NOT DETECTED NOT DETECTED Final   Streptococcus pyogenes NOT DETECTED NOT DETECTED Final   A.calcoaceticus-baumannii NOT DETECTED NOT DETECTED Final   Bacteroides fragilis NOT DETECTED NOT DETECTED Final   Enterobacterales DETECTED (A) NOT DETECTED Final    Comment: Enterobacterales represent a large order of gram negative bacteria, not a single organism. CRITICAL RESULT CALLED TO, READ BACK BY AND VERIFIED WITH: Roselie Skinner PharmD 15:20 07/08/20 ([email or web address])    Enterobacter cloacae complex NOT DETECTED NOT DETECTED Final   Escherichia coli DETECTED (A) NOT DETECTED Final    Comment: CRITICAL RESULT CALLED TO, READ BACK BY AND VERIFIED WITH: Roselie Skinner PharmD 15:20 07/08/20 ([email or web address])    Klebsiella aerogenes NOT DETECTED NOT DETECTED Final   Klebsiella oxytoca NOT DETECTED NOT DETECTED Final   Klebsiella pneumoniae NOT DETECTED NOT DETECTED Final   Proteus species NOT DETECTED NOT DETECTED Final   Salmonella species NOT DETECTED NOT DETECTED Final   Serratia marcescens NOT DETECTED NOT  DETECTED Final   Haemophilus influenzae NOT DETECTED NOT DETECTED Final   Neisseria meningitidis NOT DETECTED NOT DETECTED Final   Pseudomonas aeruginosa NOT DETECTED NOT DETECTED Final   Stenotrophomonas maltophilia NOT DETECTED NOT DETECTED Final   Candida albicans NOT DETECTED NOT DETECTED Final   Candida auris NOT DETECTED NOT DETECTED Final   Candida glabrata NOT DETECTED NOT DETECTED Final   Candida krusei NOT DETECTED NOT DETECTED Final   Candida parapsilosis NOT DETECTED NOT DETECTED Final   Candida tropicalis NOT DETECTED NOT DETECTED Final   Cryptococcus neoformans/gattii NOT DETECTED NOT DETECTED Final   CTX-M ESBL NOT DETECTED NOT DETECTED Final   Carbapenem resistance IMP NOT DETECTED NOT DETECTED Final   Carbapenem resistance KPC NOT DETECTED NOT DETECTED Final   Carbapenem resistance NDM NOT DETECTED NOT DETECTED Final   Carbapenem resist OXA 48 LIKE NOT DETECTED NOT DETECTED Final   Carbapenem resistance VIM NOT DETECTED NOT DETECTED Final    Comment: Performed at [facility], [address], [place], [place] [address]  SARS Coronavirus 2 by RT PCR (hospital order, performed in [facility] hospital lab) Nasopharyngeal Nasopharyngeal Swab     Status: None   Collection Time: 07/04/20 11:46 PM   Specimen: Nasopharyngeal Swab  Result Value Ref Range Status   SARS Coronavirus 2 NEGATIVE NEGATIVE Final    Comment: (NOTE) SARS-CoV-2 target nucleic acids are NOT DETECTED.  The SARS-CoV-2 RNA is generally detectable in upper and lower respiratory specimens during  the acute phase of infection. The lowest concentration of SARS-CoV-2 viral copies this assay can detect is 250 copies / mL. A negative result does not preclude SARS-CoV-2 infection and should not be used as the sole basis for treatment or other patient management decisions.  A negative result may occur with improper specimen collection / handling, submission of specimen other than nasopharyngeal swab, presence  of viral mutation(s) within the areas targeted by this assay, and inadequate number of viral copies (<250 copies / mL). A negative result must be combined with clinical observations, patient history, and epidemiological information.  Fact Sheet for Patients:   BoilerBrush.com.cyhttps://www.fda.gov/media/136312/download  Fact Sheet for Healthcare Providers: https://pope.com/https://www.fda.gov/media/136313/download  This test is not yet approved or  cleared by the [place] FDA and has been authorized for detection and/or diagnosis of SARS-CoV-2 by FDA under an Emergency Use Authorization (EUA).  This EUA will remain in effect (meaning this test can be used) for the duration of the COVID-19 declaration under Section 564(b)(1) of the Act, 21 U.S.C. section 360bbb-3(b)(1), unless the authorization is terminated or revoked sooner.  Performed at [facility], [address] [place], [place] [address]   Urine culture     Status: Abnormal   Collection Time: 07/05/20 12:09 AM   Specimen: In/Out Cath Urine  Result Value Ref Range Status   Specimen Description   Final    IN/OUT CATH URINE Performed at [facility] [place], [address] [place], [place] [address]    Special Requests   Final    NONE Performed at [facility], [address] [place], [place] [address]    Culture MULTIPLE SPECIES PRESENT, SUGGEST RECOLLECTION (A)  Final   Report Status 07/05/2020 FINAL  Final  Urine Culture     Status: None   Collection Time: 07/07/20  2:15 AM   Specimen: Urine, Clean Catch  Result Value Ref Range Status   Specimen Description URINE, CLEAN CATCH  Final   Special Requests Immunocompromised  Final   Culture   Final    NO GROWTH Performed at [facility], [address] [place], [place] [address]    Report Status 07/09/2020 FINAL  Final  Blood culture (routine x 2)     Status: None (Preliminary result)   Collection Time: 07/08/20  5:29 PM   Specimen: BLOOD  Result Value Ref  Range Status   Specimen Description BLOOD SITE NOT SPECIFIED  Final   Special Requests   Final    BOTTLES DRAWN AEROBIC AND ANAEROBIC Blood Culture adequate volume   Culture   Final    NO GROWTH < 12 HOURS Performed at [facility], [address] [place], [place] [address]    Report Status PENDING  Incomplete  SARS Coronavirus 2 by RT PCR (hospital order, performed in [facility] hospital lab) Nasopharyngeal Nasopharyngeal Swab     Status: Abnormal   Collection Time: 07/08/20  5:34 PM   Specimen: Nasopharyngeal Swab  Result Value Ref Range Status   SARS Coronavirus 2 POSITIVE (A) NEGATIVE Final    Comment: RESULT CALLED TO, READ BACK BY AND VERIFIED WITH: P SIDBURY RN 07/08/20 AT 2027 SK (NOTE) SARS-CoV-2 target nucleic acids are DETECTED  SARS-CoV-2 RNA is generally detectable in upper respiratory specimens  during the acute phase of infection.  Positive results are indicative  of the presence of the identified virus, but do not rule out bacterial infection or co-infection with other pathogens not  detected by the test.  Clinical correlation with patient history and  other diagnostic information is necessary to determine patient infection status.  The expected result is negative.  Fact Sheet for Patients:   BoilerBrush.com.cy   Fact Sheet for Healthcare Providers:   https://pope.com/    This test is not yet approved or cleared by the [place] FDA and  has been authorized for detection and/or diagnosis of SARS-CoV-2 by FDA under an Emergency Use Authorization (EUA).  This EUA will remain in effect (meaning this test  can be used) for the duration of  the COVID-19 declaration under Section 564(b)(1) of the Act, 21 U.S.C. section 360-bbb-3(b)(1), unless the authorization is terminated or revoked sooner.  Performed at [facility], [address], [place], [place] [address]     FURTHER DISCHARGE  INSTRUCTIONS:  Get Medicines reviewed and adjusted: Please take all your medications with you for your next visit with your Primary MD  Laboratory/radiological data: Please request your Primary MD to go over all hospital tests and procedure/radiological results at the follow up, please ask your Primary MD to get all Hospital records sent to his/her office.  In some cases, they will be blood work, cultures and biopsy results pending at the time of your discharge. Please request that your primary care M.D. goes through all the records of your hospital data and follows up on these results.  Also Note the following: If you experience worsening of your admission symptoms, develop shortness of breath, life threatening emergency, suicidal or homicidal thoughts you must seek medical attention immediately by calling 911 or calling your MD immediately  if symptoms less severe.  You must read complete instructions/literature along with all the possible adverse reactions/side effects for all the Medicines you take and that have been prescribed to you. Take any new Medicines after you have completely understood and accpet all the possible adverse reactions/side effects.   Do not drive when taking Pain medications or sleeping medications (Benzodaizepines)  Do not take more than prescribed Pain, Sleep and Anxiety Medications. It is not advisable to combine anxiety,sleep and pain medications without talking with your primary care practitioner  Special Instructions: If you have smoked or chewed Tobacco  in the last 2 yrs please stop smoking, stop any regular Alcohol  and or any Recreational drug use.  Wear Seat belts while driving.  Please note: You were cared for by a hospitalist during your hospital stay. Once you are discharged, your primary care physician will handle any further medical issues. Please note that NO REFILLS for any discharge medications will be authorized once you are discharged, as it is  imperative that you return to your primary care physician (or establish a relationship with a primary care physician if you do not have one) for your post hospital discharge needs so that they can reassess your need for medications and monitor your lab values.  Total Time spent coordinating discharge including counseling, education and face to face time equals 35 minutes.  SignedJeoffrey Massed 07/10/2020 10:52 AM

## 2020-07-10 NOTE — Discharge Instructions (Signed)
Person Under Monitoring Name: Chelsea Hammond  Location: [address] [place] [address]   Infection Prevention Recommendations for Individuals Confirmed to have, or Being Evaluated for, 2019 Novel Coronavirus (COVID-19) Infection Who Receive Care at Home  Individuals who are confirmed to have, or are being evaluated for, COVID-19 should follow the prevention steps below until a healthcare provider or local or state health department says they can return to normal activities.  Stay home except to get medical care You should restrict activities outside your home, except for getting medical care. Do not go to work, school, or public areas, and do not use public transportation or taxis.  Call ahead before visiting your doctor Before your medical appointment, call the healthcare provider and tell them that you have, or are being evaluated for, COVID-19 infection. This will help the healthcare provider's office take steps to keep other people from getting infected. Ask your healthcare provider to call the local or state health department.  Monitor your symptoms Seek prompt medical attention if your illness is worsening (e.g., difficulty breathing). Before going to your medical appointment, call the healthcare provider and tell them that you have, or are being evaluated for, COVID-19 infection. Ask your healthcare provider to call the local or state health department.  Wear a facemask You should wear a facemask that covers your nose and mouth when you are in the same room with other people and when you visit a healthcare provider. People who live with or visit you should also wear a facemask while they are in the same room with you.  Separate yourself from other people in your home As much as possible, you should stay in a different room from other people in your home. Also, you should use a separate bathroom, if available.  Avoid sharing household items You  should not share dishes, drinking glasses, cups, eating utensils, towels, bedding, or other items with other people in your home. After using these items, you should wash them thoroughly with soap and water.  Cover your coughs and sneezes Cover your mouth and nose with a tissue when you cough or sneeze, or you can cough or sneeze into your sleeve. Throw used tissues in a lined trash can, and immediately wash your hands with soap and water for at least 20 seconds or use an alcohol-based hand rub.  Wash your [employer] your hands often and thoroughly with soap and water for at least 20 seconds. You can use an alcohol-based hand sanitizer if soap and water are not available and if your hands are not visibly dirty. Avoid touching your eyes, nose, and mouth with unwashed hands.   Prevention Steps for Caregivers and Household Members of Individuals Confirmed to have, or Being Evaluated for, COVID-19 Infection Being Cared for in the Home  If you live with, or provide care at home for, a person confirmed to have, or being evaluated for, COVID-19 infection please follow these guidelines to prevent infection:  Follow healthcare provider's instructions Make sure that you understand and can help the patient follow any healthcare provider instructions for all care.  Provide for the patient's basic needs You should help the patient with basic needs in the home and provide support for getting groceries, prescriptions, and other personal needs.  Monitor the patient's symptoms If they are getting sicker, call his or her medical provider and tell them that the patient has, or is being evaluated for, COVID-19 infection. This will help the healthcare provider's office take  steps to keep other people from getting infected. Ask the healthcare provider to call the local or state health department.  Limit the number of people who have contact with the patient  If possible, have only one caregiver for the  patient.  Other household members should stay in another home or place of residence. If this is not possible, they should stay  in another room, or be separated from the patient as much as possible. Use a separate bathroom, if available.  Restrict visitors who do not have an essential need to be in the home.  Keep older adults, very young children, and other sick people away from the patient Keep older adults, very young children, and those who have compromised immune systems or chronic health conditions away from the patient. This includes people with chronic heart, lung, or kidney conditions, diabetes, and cancer.  Ensure good ventilation Make sure that shared spaces in the home have good air flow, such as from an air conditioner or an opened window, weather permitting.  Wash your hands often  Wash your hands often and thoroughly with soap and water for at least 20 seconds. You can use an alcohol based hand sanitizer if soap and water are not available and if your hands are not visibly dirty.  Avoid touching your eyes, nose, and mouth with unwashed hands.  Use disposable paper towels to dry your hands. If not available, use dedicated cloth towels and replace them when they become wet.  Wear a facemask and gloves  Wear a disposable facemask at all times in the room and gloves when you touch or have contact with the patient's blood, body fluids, and/or secretions or excretions, such as sweat, saliva, sputum, nasal mucus, vomit, urine, or feces.  Ensure the mask fits over your nose and mouth tightly, and do not touch it during use.  Throw out disposable facemasks and gloves after using them. Do not reuse.  Wash your hands immediately after removing your facemask and gloves.  If your personal clothing becomes contaminated, carefully remove clothing and launder. Wash your hands after handling contaminated clothing.  Place all used disposable facemasks, gloves, and other waste in a lined  container before disposing them with other household waste.  Remove gloves and wash your hands immediately after handling these items.  Do not share dishes, glasses, or other household items with the patient  Avoid sharing household items. You should not share dishes, drinking glasses, cups, eating utensils, towels, bedding, or other items with a patient who is confirmed to have, or being evaluated for, COVID-19 infection.  After the person uses these items, you should wash them thoroughly with soap and water.  Wash laundry thoroughly  Immediately remove and wash clothes or bedding that have blood, body fluids, and/or secretions or excretions, such as sweat, saliva, sputum, nasal mucus, vomit, urine, or feces, on them.  Wear gloves when handling laundry from the patient.  Read and follow directions on labels of laundry or clothing items and detergent. In general, wash and dry with the warmest temperatures recommended on the label.  Clean all areas the individual has used often  Clean all touchable surfaces, such as counters, tabletops, doorknobs, bathroom fixtures, toilets, phones, keyboards, tablets, and bedside tables, every day. Also, clean any surfaces that may have blood, body fluids, and/or secretions or excretions on them.  Wear gloves when cleaning surfaces the patient has come in contact with.  Use a diluted bleach solution (e.g., dilute bleach with 1 part bleach  and 10 parts water) or a household disinfectant with a label that says EPA-registered for coronaviruses. To make a bleach solution at home, add 1 tablespoon of bleach to 1 quart (4 cups) of water. For a larger supply, add  cup of bleach to 1 gallon (16 cups) of water.  Read labels of cleaning products and follow recommendations provided on product labels. Labels contain instructions for safe and effective use of the cleaning product including precautions you should take when applying the product, such as wearing gloves or  eye protection and making sure you have good ventilation during use of the product.  Remove gloves and wash hands immediately after cleaning.  Monitor yourself for signs and symptoms of illness Caregivers and household members are considered close contacts, should monitor their health, and will be asked to limit movement outside of the home to the extent possible. Follow the monitoring steps for close contacts listed on the symptom monitoring form.   ? If you have additional questions, contact your local health department or call the epidemiologist on call at [phone] (available 24/7). ? This guidance is subject to change. For the most up-to-date guidance from [facility], please refer to their website: YouBlogs.pl

## 2020-07-13 LAB — CULTURE, BLOOD (ROUTINE X 2)
Culture: NO GROWTH
Special Requests: ADEQUATE

## 2020-07-14 LAB — CULTURE, BLOOD (ROUTINE X 2): Culture: NO GROWTH

## 2020-09-03 ENCOUNTER — Emergency Department (HOSPITAL_COMMUNITY): Payer: BC Managed Care – PPO

## 2020-09-03 ENCOUNTER — Encounter (HOSPITAL_COMMUNITY): Payer: Self-pay | Admitting: Emergency Medicine

## 2020-09-03 ENCOUNTER — Ambulatory Visit (INDEPENDENT_AMBULATORY_CARE_PROVIDER_SITE_OTHER)
Admission: RE | Admit: 2020-09-03 | Discharge: 2020-09-03 | Disposition: A | Discharge status: Home / Self Care | Payer: BC Managed Care – PPO | Source: Ambulatory Visit

## 2020-09-03 ENCOUNTER — Other Ambulatory Visit: Payer: Self-pay

## 2020-09-03 ENCOUNTER — Encounter (HOSPITAL_COMMUNITY): Payer: Self-pay

## 2020-09-03 ENCOUNTER — Emergency Department (HOSPITAL_COMMUNITY)
Admission: EM | Admit: 2020-09-03 | Discharge: 2020-09-04 | Disposition: A | Discharge status: Home / Self Care | Payer: BC Managed Care – PPO | Attending: Emergency Medicine | Admitting: Emergency Medicine

## 2020-09-03 VITALS — BP 109/74 | HR 109 | Temp 99.3°F | Resp 16

## 2020-09-03 DIAGNOSIS — Z8616 Personal history of COVID-19: Secondary | ICD-10-CM | POA: Diagnosis not present

## 2020-09-03 DIAGNOSIS — R109 Unspecified abdominal pain: Secondary | ICD-10-CM | POA: Diagnosis present

## 2020-09-03 DIAGNOSIS — N10 Acute tubulo-interstitial nephritis: Secondary | ICD-10-CM | POA: Insufficient documentation

## 2020-09-03 DIAGNOSIS — N12 Tubulo-interstitial nephritis, not specified as acute or chronic: Secondary | ICD-10-CM

## 2020-09-03 DIAGNOSIS — R1031 Right lower quadrant pain: Secondary | ICD-10-CM

## 2020-09-03 LAB — COMPREHENSIVE METABOLIC PANEL
ALT: 38 U/L (ref 0–44)
AST: 25 U/L (ref 15–41)
Albumin: 4.3 g/dL (ref 3.5–5.0)
Alkaline Phosphatase: 79 U/L (ref 38–126)
Anion gap: 12 (ref 5–15)
BUN: 10 mg/dL (ref 6–20)
CO2: 23 mmol/L (ref 22–32)
Calcium: 9.6 mg/dL (ref 8.9–10.3)
Chloride: 103 mmol/L (ref 98–111)
Creatinine, Ser: 0.66 mg/dL (ref 0.44–1.00)
GFR, Estimated: >60 mL/min (ref >60)
Glucose, Bld: 107 mg/dL — ABNORMAL HIGH (ref 70–99)
Potassium: 4.2 mmol/L (ref 3.5–5.1)
Sodium: 138 mmol/L (ref 135–145)
Total Bilirubin: 0.5 mg/dL (ref 0.3–1.2)
Total Protein: 8 g/dL (ref 6.5–8.1)

## 2020-09-03 LAB — POCT URINALYSIS DIPSTICK, ED / UC
Bilirubin Urine: NEGATIVE
Glucose, UA: NEGATIVE mg/dL
Ketones, ur: NEGATIVE mg/dL
Leukocytes,Ua: NEGATIVE
Nitrite: NEGATIVE
Protein, ur: NEGATIVE mg/dL
Specific Gravity, Urine: 1.02 (ref 1.005–1.030)
Urobilinogen, UA: 0.2 mg/dL (ref 0.0–1.0)
pH: 8.5 — ABNORMAL HIGH (ref 5.0–8.0)

## 2020-09-03 LAB — URINALYSIS, ROUTINE W REFLEX MICROSCOPIC
Bilirubin Urine: NEGATIVE
Glucose, UA: NEGATIVE mg/dL
Ketones, ur: NEGATIVE mg/dL
Nitrite: NEGATIVE
Protein, ur: NEGATIVE mg/dL
Specific Gravity, Urine: 1.024 (ref 1.005–1.030)
pH: 8 (ref 5.0–8.0)

## 2020-09-03 LAB — LIPASE, BLOOD: Lipase: 41 U/L (ref 11–51)

## 2020-09-03 LAB — CBC
HCT: 40.5 % (ref 36.0–46.0)
Hemoglobin: 13 g/dL (ref 12.0–15.0)
MCH: 31 pg (ref 26.0–34.0)
MCHC: 32.1 g/dL (ref 30.0–36.0)
MCV: 96.7 fL (ref 80.0–100.0)
Platelets: 280 10*3/uL (ref 150–400)
RBC: 4.19 MIL/uL (ref 3.87–5.11)
RDW: 13.5 % (ref 11.5–15.5)
WBC: 6.2 10*3/uL (ref 4.0–10.5)
nRBC: 0 % (ref 0.0–0.2)

## 2020-09-03 LAB — I-STAT BETA HCG BLOOD, ED (MC, WL, AP ONLY): I-stat hCG, quantitative: <5 m[IU]/mL (ref <5)

## 2020-09-03 LAB — POC URINE PREG, ED: Preg Test, Ur: NEGATIVE

## 2020-09-03 MED ORDER — IOHEXOL 300 MG/ML  SOLN
100.0000 mL | Freq: Once | INTRAMUSCULAR | Status: AC | PRN
  Administered 2020-09-03: 100 mL via INTRAVENOUS

## 2020-09-03 MED ORDER — IBUPROFEN 800 MG PO TABS
800.0000 mg | ORAL_TABLET | Freq: Once | ORAL | Status: AC
  Administered 2020-09-03: 800 mg via ORAL

## 2020-09-03 MED ORDER — SODIUM CHLORIDE 0.9 % IV SOLN
1.0000 g | Freq: Once | INTRAVENOUS | Status: AC
  Administered 2020-09-04: 1 g via INTRAVENOUS
  Filled 2020-09-03: qty 10

## 2020-09-03 MED ORDER — IBUPROFEN 800 MG PO TABS
ORAL_TABLET | ORAL | Status: AC
  Filled 2020-09-03: qty 1

## 2020-09-03 MED ORDER — SODIUM CHLORIDE 0.9 % IV BOLUS
1000.0000 mL | Freq: Once | INTRAVENOUS | Status: AC
  Administered 2020-09-03: 1000 mL via INTRAVENOUS

## 2020-09-03 NOTE — Discharge Instructions (Addendum)
Please report to the emergency room for a CT scan of your abdomen for rule out of appendicitis.

## 2020-09-03 NOTE — ED Provider Notes (Signed)
[facility]   MRN: [id number] DOB: February 01, 1996  Subjective:   Chelsea Hammond is a 24 y.o. female presenting for few day hx of acute onset urinary frequency, RLQ abdominal pain. Has a hx of pyelo and is worried about this recurring.  Denies fever, nausea, vomiting, dysuria, flank pain, vaginal discharge.  Patient states that she is not sexually active at all.  She does not have any concern for an STI.  She denies gynecologic history.  Patient did have 2 hospitalizations for pyelonephritis as shown on her chart review.  Denies any history of difficulty with her appendix.  Has been trying to hydrate well.  No current facility-administered medications for this encounter.  Current Outpatient Medications:  .  ARIPiprazole (ABILIFY) 5 MG tablet, Take 5 mg by mouth at bedtime., Disp: , Rfl:  .  sertraline (ZOLOFT) 25 MG tablet, Take 25 mg by mouth daily., Disp: , Rfl:    Allergies  Allergen Reactions  . Codeine     tachycardia    Past Medical History:  Diagnosis Date  . Bipolar disorder (HCC) 07/05/2020     Past Surgical History:  Procedure Laterality Date  . ADENOIDECTOMY    . TONSILLECTOMY      Family History  Problem Relation Age of Onset  . Hypertension Other     Social History   Tobacco Use  . Smoking status: Never Smoker  . Smokeless tobacco: Never Used  Substance Use Topics  . Alcohol use: No    Alcohol/week: 0.0 standard drinks  . Drug use: No    ROS   Objective:   Vitals: BP 109/74 (BP Location: Right Arm)   Pulse (!) 109   Temp 99.3 F (37.4 C) (Oral)   Resp 16   SpO2 97%   Physical Exam Constitutional:      General: She is not in acute distress.    Appearance: Normal appearance. She is well-developed and normal weight. She is not ill-appearing, toxic-appearing or diaphoretic.  HENT:     Head: Normocephalic and atraumatic.     Right Ear: External ear normal.     Left Ear: External ear normal.     Nose: Nose normal.      Mouth/Throat:     Mouth: Mucous membranes are moist.     Pharynx: Oropharynx is clear.  Eyes:     General: No scleral icterus.    Extraocular Movements: Extraocular movements intact.     Pupils: Pupils are equal, round, and reactive to light.  Cardiovascular:     Rate and Rhythm: Normal rate and regular rhythm.     Heart sounds: Normal heart sounds. No murmur heard.  No friction rub. No gallop.   Pulmonary:     Effort: Pulmonary effort is normal. No respiratory distress.     Breath sounds: Normal breath sounds. No stridor. No wheezing, rhonchi or rales.  Abdominal:     General: Bowel sounds are normal. There is no distension.     Palpations: Abdomen is soft. There is no mass.     Tenderness: There is abdominal tenderness in the right lower quadrant. There is no right CVA tenderness, left CVA tenderness, guarding or rebound. Positive signs include McBurney's sign.  Skin:    General: Skin is warm and dry.     Coloration: Skin is not pale.     Findings: No rash.  Neurological:     General: No focal deficit present.     Mental Status: She is alert and  oriented to person, place, and time.  Psychiatric:        Mood and Affect: Mood normal.        Behavior: Behavior normal.        Thought Content: Thought content normal.        Judgment: Judgment normal.     Results for orders placed or performed during the hospital encounter of 09/03/20 (from the past 24 hour(s))  POC Urinalysis dipstick     Status: Abnormal   Collection Time: 09/03/20  6:46 PM  Result Value Ref Range   Glucose, UA NEGATIVE NEGATIVE mg/dL   Bilirubin Urine NEGATIVE NEGATIVE   Ketones, ur NEGATIVE NEGATIVE mg/dL   Specific Gravity, Urine 1.020 1.005 - 1.030   Hgb urine dipstick TRACE (A) NEGATIVE   pH 8.5 (H) 5.0 - 8.0   Protein, ur NEGATIVE NEGATIVE mg/dL   Urobilinogen, UA 0.2 0.0 - 1.0 mg/dL   Nitrite NEGATIVE NEGATIVE   Leukocytes,Ua NEGATIVE NEGATIVE  POC urine pregnancy     Status: None   Collection  Time: 09/03/20  6:54 PM  Result Value Ref Range   Preg Test, Ur NEGATIVE NEGATIVE    Assessment and Plan :   PDMP not reviewed this encounter.  1. Right lower quadrant abdominal pain     Patient has focal tenderness of the right lower quadrant.  Urinalysis is unremarkable.  Given her previous history of pyelonephritis and current physical exam findings including tachycardia, I recommended patient report to the emergency room for further work-up and consideration for CT scan to rule out an acute abdomen, appendicitis.  She is agreeable to this, contracts for safety and will report to the ER now.   Wallis Bamberg, [place] 09/04/20 [phone]

## 2020-09-03 NOTE — ED Notes (Signed)
Pt transported to CT ?

## 2020-09-03 NOTE — ED Triage Notes (Signed)
Pt presents today with RLQ pain that has been bothering her for a few days. Back in August she was dx with pyelonephritis. She has not been able to hold her urine as long. Having urinary frequency.

## 2020-09-03 NOTE — ED Provider Notes (Signed)
[facility] Note   CSN: [id number] Arrival date & time: 09/03/20  [id number]     History Chief Complaint  Patient presents with  . Abdominal Pain  . Flank Pain    Chelsea Hammond is a 24 y.o. female with a medical hx listed below presents to the Emergency Department complaining of gradual, persistent, progressively worsening right sided abd pain onset 2-3 days ago. Associated symptoms include sharp and dull pain.  Pt reports sitting and standing make her symptoms worse, laying makes them some better.  Nothing makes the pain resolve.  Pt reports mild constipation, but no nausea or vomiting.  Pain is not altered by food intake.  She reports eating and drinking normally.  Pt denies fever, chills, headache, neck pain, chest pain, SOB, weakness, dizziness, syncope, back pain.  LMP: finishing now.  Denies vaginal discharge, urinary frequency, urinary urgency.  Mother at bedside reports recent UTI/pyelonephritis and sepsis.   They are concerned for same today.  [facility]  Records reviewed: Pt admitted on 8/23 with sepsis secondary to right sided pyelonephritis.  Blood cultures positive for Enterobacter and E. Coli prompting a second admission.     The history is provided by the patient, medical records and a parent. No language interpreter was used.       Past Medical History:  Diagnosis Date  . Bipolar disorder (HCC) 07/05/2020    Patient Active Problem List   Diagnosis Date Noted  . E coli bacteremia 07/09/2020  . Bacteremia due to Escherichia coli 07/08/2020  . Constipation 07/08/2020  . COVID-19 virus infection 07/08/2020  . Pyelonephritis 07/05/2020  . Anemia 07/05/2020  . Thrombocytopenia (HCC) 07/05/2020  . Hypokalemia 07/05/2020  . Hyponatremia 07/05/2020  . Bipolar disorder (HCC) 07/05/2020  . Sepsis (HCC) 07/05/2020    Past Surgical History:  Procedure Laterality Date  . ADENOIDECTOMY      . TONSILLECTOMY       OB History   No obstetric history on file.     Family History  Problem Relation Age of Onset  . Hypertension Other     Social History   Tobacco Use  . Smoking status: Never Smoker  . Smokeless tobacco: Never Used  Substance Use Topics  . Alcohol use: No    Alcohol/week: 0.0 standard drinks  . Drug use: No    Home Medications Prior to Admission medications   Medication Sig Start Date End Date Taking? Authorizing Provider  ARIPiprazole (ABILIFY) 5 MG tablet Take 5 mg by mouth at bedtime.   Yes [provider]  sertraline (ZOLOFT) 25 MG tablet Take 25 mg by mouth daily. 07/26/20  Yes [provider]  cephALEXin (KEFLEX) 500 MG capsule Take 1 capsule (500 mg total) by mouth 4 (four) times daily. 09/04/20   Marae Cottrell, Dahlia ClientHannah, PA-C    Allergies    Codeine  Review of Systems   Review of Systems  Constitutional: Negative for appetite change, diaphoresis, fatigue, fever and unexpected weight change.  HENT: Negative for mouth sores.   Eyes: Negative for visual disturbance.  Respiratory: Negative for cough, chest tightness, shortness of breath and wheezing.   Cardiovascular: Negative for chest pain.  Gastrointestinal: Positive for abdominal pain and constipation. Negative for diarrhea, nausea and vomiting.  Endocrine: Negative for polydipsia, polyphagia and polyuria.  Genitourinary: Negative for dysuria, frequency, hematuria and urgency.  Musculoskeletal: Negative for back pain and neck stiffness.  Skin: Negative for rash.  Allergic/Immunologic:  Negative for immunocompromised state.  Neurological: Negative for syncope, light-headedness and headaches.  Hematological: Does not bruise/bleed easily.  Psychiatric/Behavioral: Negative for sleep disturbance. The patient is not nervous/anxious.     Physical Exam Updated Vital Signs BP 120/89 (BP Location: Right Arm)   Pulse (!) 110   Temp 97.7 F (36.5 C) (Oral)   Resp 16   Ht 5\' 5"   (1.651 m)   Wt 56.7 kg   SpO2 95%   BMI 20.80 kg/m   Physical Exam Vitals and nursing note reviewed.  Constitutional:      General: She is not in acute distress.    Appearance: She is not diaphoretic.  HENT:     Head: Normocephalic.  Eyes:     General: No scleral icterus.    Conjunctiva/sclera: Conjunctivae normal.  Cardiovascular:     Rate and Rhythm: Regular rhythm. Tachycardia present.     Pulses: Normal pulses.          Radial pulses are 2+ on the right side and 2+ on the left side.     Comments: Mild tachycardia Pulmonary:     Effort: Pulmonary effort is normal. No tachypnea, accessory muscle usage, prolonged expiration, respiratory distress or retractions.     Breath sounds: Normal breath sounds. No stridor.     Comments: Equal chest rise. No increased work of breathing. Abdominal:     General: Bowel sounds are normal. There is no distension.     Palpations: Abdomen is soft.     Tenderness: There is abdominal tenderness in the right lower quadrant. There is guarding. There is no right CVA tenderness, left CVA tenderness or rebound.     Hernia: No hernia is present.  Musculoskeletal:     Cervical back: Normal range of motion.     Comments: Moves all extremities equally and without difficulty.  Skin:    General: Skin is warm and dry.     Capillary Refill: Capillary refill takes less than 2 seconds.  Neurological:     Mental Status: She is alert.     GCS: GCS eye subscore is 4. GCS verbal subscore is 5. GCS motor subscore is 6.     Comments: Speech is clear and goal oriented.  Psychiatric:        Mood and Affect: Mood is anxious.     ED Results / Procedures / Treatments   Labs (all labs ordered are listed, but only abnormal results are displayed) Labs Reviewed  COMPREHENSIVE METABOLIC PANEL - Abnormal; Notable for the following components:      Result Value   Glucose, Bld 107 (*)    All other components within normal limits  URINALYSIS, ROUTINE W REFLEX  MICROSCOPIC - Abnormal; Notable for the following components:   Hgb urine dipstick SMALL (*)    Leukocytes,Ua TRACE (*)    Bacteria, UA RARE (*)    All other components within normal limits  URINE CULTURE  LIPASE, BLOOD  CBC  I-STAT BETA HCG BLOOD, ED ([facility], [facility], [facility] ONLY)    EKG None  Radiology CT ABDOMEN PELVIS W CONTRAST  Result Date: 09/03/2020 CLINICAL DATA:  Right lower quadrant abdominal pain for a few days. Previous diagnosis of pyelonephritis in August. EXAM: CT ABDOMEN AND PELVIS WITH CONTRAST TECHNIQUE: Multidetector CT imaging of the abdomen and pelvis was performed using the standard protocol following bolus administration of intravenous contrast. CONTRAST:  September OMNIPAQUE IOHEXOL 300 MG/ML  SOLN COMPARISON:  07/05/2020 FINDINGS: Lower chest: The lung bases are clear. Hepatobiliary:  No focal liver abnormality is seen. No gallstones, gallbladder wall thickening, or biliary dilatation. Pancreas: Unremarkable. No pancreatic ductal dilatation or surrounding inflammatory changes. Spleen: Normal in size without focal abnormality. Adrenals/Urinary Tract: No adrenal gland nodules. Persistence of heterogeneous nephrogram in the right kidney consistent with pyelonephritis. Left kidney is unremarkable. No hydronephrosis or hydroureter. Bladder is normal. No bladder wall thickening or filling defect. Stomach/Bowel: Stomach is within normal limits. Appendix appears normal. No evidence of bowel wall thickening, distention, or inflammatory changes. Vascular/Lymphatic: No significant vascular findings are present. No enlarged abdominal or pelvic lymph nodes. Reproductive: Uterus and bilateral adnexa are unremarkable. Small amount of free fluid in the pelvis is likely physiologic. Other: No free air in the abdomen. Abdominal wall musculature appears intact. Musculoskeletal: No acute or significant osseous findings. Mild thoracolumbar scoliosis convex towards the left. IMPRESSION: 1. Persistence of  heterogeneous nephrogram in the right kidney consistent with pyelonephritis. 2. Previous changes of cystitis have resolved in the interval. 3. No evidence of bowel obstruction or inflammation. Appendix is normal. 4. Small amount of free fluid in the pelvis is likely physiologic. Electronically Signed   By: Burman Nieves M.D.   On: 09/03/2020 23:30    Procedures Procedures (including critical care time)  Medications Ordered in ED Medications  cefTRIAXone (ROCEPHIN) 1 g in sodium chloride 0.9 % 100 mL IVPB (1 g Intravenous New Bag/Given 09/04/20 0008)  sodium chloride 0.9 % bolus 1,000 mL (1,000 mLs Intravenous New Bag/Given 09/03/20 2240)  iohexol (OMNIPAQUE) 300 MG/ML solution 100 mL (100 mLs Intravenous Contrast Given 09/03/20 2303)    ED Course  I have reviewed the triage vital signs and the nursing notes.  Pertinent labs & imaging results that were available during my care of the patient were reviewed by me and considered in my medical decision making (see chart for details).  Clinical Course as of Sep 04 29  Fri Sep 03, 2020  2221 Tachycardia on arrival  Pulse Rate(!): 110 [HM]    Clinical Course User Index [HM] Jersee Winiarski, Dahlia Client, PA-C   MDM Rules/Calculators/A&P                           Pt presents with RLQ abd pain.  Pt states flank pain, but further history clarifies that pt has abd pain and no flank or back pain.  No current urinary symptoms.  Labs reassuring without leukocytosis, no electrolyte abnormalities.  UA with small amount of hgb, trace leukocytes a few WBCs and rare bacteria.  RLQ abd pain on exam.  Concern for constipation vs colitis vs appendicitis though this could be early pyelo.  CT scan pending.  12:30 AM CT scan consistent with persistent pyelonephritis.  I personally evaluated these images.  No evidence of appendicitis or colitis.  Patient given Rocephin here in the emergency department.  Previous urine culture results reviewed.  Growth showed  multiple species.  Labs today are reassuring.  Heart rate improved without intervention.  No evidence of sepsis.  Patient will be discharged with Keflex and close urology follow-up for further evaluation.  Discussed reasons to return immediately to the emergency department.  Mother and child state understanding and are in agreement with the plan.  The patient was discussed with Dr. Denton Lank who agrees with the treatment plan.   Final Clinical Impression(s) / ED Diagnoses Final diagnoses:  Pyelonephritis    Rx / DC Orders ED Discharge Orders         Ordered  cephALEXin (KEFLEX) 500 MG capsule  4 times daily        09/04/20 0029           Niamh Rada, Boyd Kerbs 09/04/20 0032    Cathren Laine, MD 09/04/20 [phone]

## 2020-09-03 NOTE — ED Triage Notes (Signed)
Pt endorses right sided abd and flank pain for 3-4 days. Hx of kidney infection. Endorses urinary frequency.

## 2020-09-04 MED ORDER — CEPHALEXIN 500 MG PO CAPS: 500.0000 mg | ORAL_CAPSULE | Freq: Four times a day (QID) | ORAL | 0 refills | Status: DC

## 2020-09-04 NOTE — ED Notes (Signed)
Patient verbalizes understanding of discharge instructions. Opportunity for questioning and answers were provided. Armband removed by staff, pt discharged from ED ambulatory to home.  

## 2020-09-04 NOTE — Discharge Instructions (Signed)
1. Medications: Keflex, usual home medications 2. Treatment: rest, drink plenty of fluids, take medications as prescribed 3. Follow Up: Please followup with your primary doctor in 2-3 days and the urologist within 1 week; return to the ER for fevers, persistent vomiting, worsening abdominal pain or other concerning symptoms.

## 2020-09-06 LAB — URINE CULTURE
Culture: >=100000 — AB
Culture: >=100000 — AB

## 2020-09-07 ENCOUNTER — Telehealth: Payer: Self-pay | Admitting: Emergency Medicine

## 2020-09-07 NOTE — Telephone Encounter (Signed)
Post ED Visit - Positive Culture Follow-up  Culture report reviewed by antimicrobial stewardship pharmacist: [name] []  , Pharm.D. []  Enzo Bi, Pharm.D., BCPS AQ-ID []  , Pharm.D., BCPS []  Celedonio Miyamoto, Pharm.D., BCPS []  [place], Garvin Fila.D., BCPS, AAHIVP []  , Pharm.D., BCPS, AAHIVP []  Georgina Pillion, PharmD, BCPS []  , PharmD, BCPS []  [place], PharmD, BCPS []  [address], PharmD []  , PharmD, BCPS []  Estella Husk, PharmD [place] PharmD  [facility] []  , PharmD []  Phillips Climes, PharmD []  , PharmD []  Agapito Games, Rph []  ) Verlan Friends, PharmD []  , PharmD []  Mervyn Gay, PharmD []  , PharmD []  Vinnie Level, PharmD []  Francene Finders, PharmD []  [facility], PharmD []  , PharmD []  Len Childs, PharmD   Positive urine culture Treated with cephalexin, organism sensitive to the same and no further patient follow-up is required at this time.  09/07/2020, 12:50 PM

## 2020-10-02 ENCOUNTER — Other Ambulatory Visit: Payer: Self-pay

## 2020-10-02 ENCOUNTER — Encounter (HOSPITAL_COMMUNITY): Payer: Self-pay | Admitting: *Deleted

## 2020-10-02 ENCOUNTER — Encounter (HOSPITAL_COMMUNITY): Payer: Self-pay

## 2020-10-02 ENCOUNTER — Ambulatory Visit (HOSPITAL_COMMUNITY)
Admission: RE | Admit: 2020-10-02 | Discharge: 2020-10-02 | Disposition: A | Discharge status: Home / Self Care | Payer: BC Managed Care – PPO | Source: Ambulatory Visit | Attending: Family Medicine | Admitting: Family Medicine

## 2020-10-02 ENCOUNTER — Emergency Department (HOSPITAL_COMMUNITY): Payer: BC Managed Care – PPO

## 2020-10-02 ENCOUNTER — Emergency Department (HOSPITAL_COMMUNITY)
Admission: EM | Admit: 2020-10-02 | Discharge: 2020-10-02 | Disposition: A | Discharge status: Home / Self Care | Payer: BC Managed Care – PPO | Attending: Emergency Medicine | Admitting: Emergency Medicine

## 2020-10-02 DIAGNOSIS — R202 Paresthesia of skin: Secondary | ICD-10-CM | POA: Insufficient documentation

## 2020-10-02 DIAGNOSIS — G43109 Migraine with aura, not intractable, without status migrainosus: Secondary | ICD-10-CM | POA: Insufficient documentation

## 2020-10-02 DIAGNOSIS — R519 Headache, unspecified: Secondary | ICD-10-CM | POA: Diagnosis present

## 2020-10-02 DIAGNOSIS — Z8616 Personal history of COVID-19: Secondary | ICD-10-CM | POA: Diagnosis not present

## 2020-10-02 LAB — I-STAT BETA HCG BLOOD, ED (MC, WL, AP ONLY): I-stat hCG, quantitative: <5 m[IU]/mL (ref <5)

## 2020-10-02 LAB — BASIC METABOLIC PANEL
Anion gap: 11 (ref 5–15)
BUN: 15 mg/dL (ref 6–20)
CO2: 23 mmol/L (ref 22–32)
Calcium: 9.2 mg/dL (ref 8.9–10.3)
Chloride: 103 mmol/L (ref 98–111)
Creatinine, Ser: 0.64 mg/dL (ref 0.44–1.00)
GFR, Estimated: >60 mL/min (ref >60)
Glucose, Bld: 91 mg/dL (ref 70–99)
Potassium: 4.1 mmol/L (ref 3.5–5.1)
Sodium: 137 mmol/L (ref 135–145)

## 2020-10-02 LAB — CBC WITH DIFFERENTIAL/PLATELET
Abs Immature Granulocytes: 0.01 10*3/uL (ref 0.00–0.07)
Basophils Absolute: 0 10*3/uL (ref 0.0–0.1)
Basophils Relative: 1 %
Eosinophils Absolute: 0.1 10*3/uL (ref 0.0–0.5)
Eosinophils Relative: 2 %
HCT: 40.7 % (ref 36.0–46.0)
Hemoglobin: 13.2 g/dL (ref 12.0–15.0)
Immature Granulocytes: 0 %
Lymphocytes Relative: 43 %
Lymphs Abs: 2 10*3/uL (ref 0.7–4.0)
MCH: 31.1 pg (ref 26.0–34.0)
MCHC: 32.4 g/dL (ref 30.0–36.0)
MCV: 96 fL (ref 80.0–100.0)
Monocytes Absolute: 0.4 10*3/uL (ref 0.1–1.0)
Monocytes Relative: 8 %
Neutro Abs: 2.1 10*3/uL (ref 1.7–7.7)
Neutrophils Relative %: 46 %
Platelets: 205 10*3/uL (ref 150–400)
RBC: 4.24 MIL/uL (ref 3.87–5.11)
RDW: 12.8 % (ref 11.5–15.5)
WBC: 4.6 10*3/uL (ref 4.0–10.5)
nRBC: 0 % (ref 0.0–0.2)

## 2020-10-02 MED ORDER — PROCHLORPERAZINE EDISYLATE 10 MG/2ML IJ SOLN
10.0000 mg | Freq: Once | INTRAMUSCULAR | Status: AC
  Administered 2020-10-02: 10 mg via INTRAVENOUS
  Filled 2020-10-02: qty 2

## 2020-10-02 MED ORDER — SUMATRIPTAN SUCCINATE 50 MG PO TABS: 50.0000 mg | ORAL_TABLET | ORAL | 0 refills | Status: DC | PRN

## 2020-10-02 MED ORDER — KETOROLAC TROMETHAMINE 30 MG/ML IJ SOLN
15.0000 mg | Freq: Once | INTRAMUSCULAR | Status: AC
  Administered 2020-10-02: 15 mg via INTRAVENOUS
  Filled 2020-10-02: qty 1

## 2020-10-02 MED ORDER — DIPHENHYDRAMINE HCL 50 MG/ML IJ SOLN
12.5000 mg | Freq: Once | INTRAMUSCULAR | Status: AC
  Administered 2020-10-02: 12.5 mg via INTRAVENOUS
  Filled 2020-10-02: qty 1

## 2020-10-02 NOTE — ED Triage Notes (Signed)
Emergency Medicine Provider Triage Evaluation Note  Chelsea Hammond , a 24 y.o. female  was evaluated in triage.  Pt complains of headache.  Around 4 AM this morning patient states she noted "rings" in her vision bilaterally.  This lasted for approximately 30 minutes.  Afterward, she experienced right-sided frontal headache.  She also started to experience tingling in the left side of the face and the left arm.  She felt as if she experienced left-sided facial droop.  The symptoms lasted for approximately an hour before resolving completely.  Her headache lingered at a mild level.  She came into the ED because she thought she was starting to feel some of the left-sided facial tingling and because her headache has persisting.  Review of Systems  Positive: Headache Negative: Fever, extremity weakness, recent trauma, vision loss, neck pain/stiffness  Physical Exam  BP 124/73   Pulse 79   Temp 98.2 F (36.8 C) (Oral)   Resp 14   Wt 56.7 kg   SpO2 97%   BMI 20.80 kg/m  Gen:   Awake, no distress   HEENT:  Atraumatic  Resp:  Normal effort  Cardiac:  Normal rate  Abd:   Nondistended, nontender  MSK:   Moves extremities without difficulty  Neuro:  Speech clear No noted acute cognitive deficit. Sensation grossly intact to light touch in the extremities.   Grip strengths equal bilaterally.   Strength 5/5 in all extremities.  No gait disturbance.  Coordination intact.  Cranial nerves III-XII grossly intact.  Handles oral secretions without noted difficulty.  No noted phonation or speech deficit. No facial droop.   Medical Decision Making  Medically screening exam initiated at 5:55 PM.  Appropriate orders placed.  Chelsea Hammond was informed that the remainder of the evaluation will be completed by another provider, this initial triage assessment does not replace that evaluation, and the importance of remaining in the ED until their evaluation is complete.  Clinical Impression     Did not note any current neurologic deficits on my exam.  Currently relevant orders placed.       Anselm Pancoast, PA-C 10/02/20 2022

## 2020-10-02 NOTE — Discharge Instructions (Signed)
Schedule an appointment with a neurologist.  Take the medications as needed for recurrent migraine headaches.  Return as needed for worsening symptoms

## 2020-10-02 NOTE — ED Provider Notes (Signed)
[facility] EMERGENCY DEPARTMENT Provider Note   CSN: [id number] Arrival date & time: 10/02/20  1718     History Chief Complaint  Patient presents with  . Headache    Chelsea Hammond is a 24 y.o. female.  HPI   Pt first noticed a blind spot on the left side.  She then started to develop a headache.  Pt then also noticed some numbness and facial drooping on the left side.   Those symptoms resolved after about an hour.  She continues to have a headache mostly on the right side  Past Medical History:  Diagnosis Date  . Bipolar disorder (HCC) 07/05/2020    Patient Active Problem List   Diagnosis Date Noted  . E coli bacteremia 07/09/2020  . Bacteremia due to Escherichia coli 07/08/2020  . Constipation 07/08/2020  . COVID-19 virus infection 07/08/2020  . Pyelonephritis 07/05/2020  . Anemia 07/05/2020  . Thrombocytopenia (HCC) 07/05/2020  . Hypokalemia 07/05/2020  . Hyponatremia 07/05/2020  . Bipolar disorder (HCC) 07/05/2020  . Sepsis (HCC) 07/05/2020    Past Surgical History:  Procedure Laterality Date  . ADENOIDECTOMY    . TONSILLECTOMY       OB History   No obstetric history on file.     Family History  Problem Relation Age of Onset  . Hypertension Other     Social History   Tobacco Use  . Smoking status: Never Smoker  . Smokeless tobacco: Never Used  Substance Use Topics  . Alcohol use: No    Alcohol/week: 0.0 standard drinks  . Drug use: No    Home Medications Prior to Admission medications   Medication Sig Start Date End Date Taking? Authorizing Provider  ARIPiprazole (ABILIFY) 5 MG tablet Take 5 mg by mouth at bedtime.   Yes [provider]  lamoTRIgine (LAMICTAL) 25 MG tablet Take 25 mg by mouth at bedtime.  09/17/20  Yes [provider]  Omega-3 1000 MG CAPS Take 1,000 mg by mouth daily.   Yes [provider]  sertraline (ZOLOFT) 25 MG tablet Take 25 mg by mouth at bedtime.  07/26/20  Yes  [provider]  cephALEXin (KEFLEX) 500 MG capsule Take 1 capsule (500 mg total) by mouth 4 (four) times daily. Patient not taking: Reported on 10/02/2020 09/04/20   Muthersbaugh, Dahlia Client, PA-C  SUMAtriptan (IMITREX) 50 MG tablet Take 1 tablet (50 mg total) by mouth every 2 (two) hours as needed for migraine. May repeat in 2 hours if headache persists or recurs. 10/02/20   Linwood Dibbles, MD    Allergies    Codeine  Review of Systems   Review of Systems  All other systems reviewed and are negative.   Physical Exam Updated Vital Signs BP 108/76   Pulse 74   Temp 98.2 F (36.8 C) (Oral)   Resp 18   Wt 56.7 kg   LMP 09/28/2020   SpO2 99%   BMI 20.80 kg/m   Physical Exam Vitals and nursing note reviewed.  Constitutional:      General: She is not in acute distress.    Appearance: She is well-developed.  HENT:     Head: Normocephalic and atraumatic.     Right Ear: External ear normal.     Left Ear: External ear normal.  Eyes:     General: No scleral icterus.       Right eye: No discharge.        Left eye: No discharge.     Conjunctiva/sclera:  Conjunctivae normal.  Neck:     Trachea: No tracheal deviation.  Cardiovascular:     Rate and Rhythm: Normal rate and regular rhythm.  Pulmonary:     Effort: Pulmonary effort is normal. No respiratory distress.     Breath sounds: Normal breath sounds. No stridor. No wheezing or rales.  Abdominal:     General: Bowel sounds are normal. There is no distension.     Palpations: Abdomen is soft.     Tenderness: There is no abdominal tenderness. There is no guarding or rebound.  Musculoskeletal:        General: No tenderness.     Cervical back: Neck supple.  Skin:    General: Skin is warm and dry.     Findings: No rash.  Neurological:     Mental Status: She is alert and oriented to person, place, and time.     Cranial Nerves: No cranial nerve deficit (No facial droop, extraocular movements intact, tongue midline ).      Sensory: No sensory deficit.     Motor: No abnormal muscle tone or seizure activity.     Coordination: Coordination normal.     Comments: No pronator drift bilateral upper extrem, able to hold both legs off bed for 5 seconds, sensation intact in all extremities, no visual field cuts, no left or right sided neglect, normal finger-nose exam bilaterally, no nystagmus noted      ED Results / Procedures / Treatments   Labs (all labs ordered are listed, but only abnormal results are displayed) Labs Reviewed  BASIC METABOLIC PANEL  CBC WITH DIFFERENTIAL/PLATELET  I-STAT BETA HCG BLOOD, ED ([facility], [facility], [facility] ONLY)    EKG None  Radiology CT Head Wo Contrast  Result Date: 10/02/2020 CLINICAL DATA:  Headache EXAM: CT HEAD WITHOUT CONTRAST TECHNIQUE: Contiguous axial images were obtained from the base of the skull through the vertex without intravenous contrast. COMPARISON:  None. FINDINGS: Brain: No evidence of acute territorial infarction, hemorrhage, hydrocephalus,extra-axial collection or mass lesion/mass effect. Normal gray-white differentiation. Ventricles are normal in size and contour. Vascular: No hyperdense vessel or unexpected calcification. Skull: The skull is intact. No fracture or focal lesion identified. Sinuses/Orbits: The visualized paranasal sinuses and mastoid air cells are clear. The orbits and globes intact. Other: None IMPRESSION: No acute intracranial abnormality. Electronically Signed   By: Jonna Clark M.D.   On: 10/02/2020 20:29    Procedures Procedures (including critical care time)  Medications Ordered in ED Medications  prochlorperazine (COMPAZINE) injection 10 mg (10 mg Intravenous Given 10/02/20 2113)  diphenhydrAMINE (BENADRYL) injection 12.5 mg (12.5 mg Intravenous Given 10/02/20 2113)  ketorolac (TORADOL) 30 MG/ML injection 15 mg (15 mg Intravenous Given 10/02/20 2112)    ED Course  I have reviewed the triage vital signs and the nursing notes.  Pertinent labs &  imaging results that were available during my care of the patient were reviewed by me and considered in my medical decision making (see chart for details).    MDM Rules/Calculators/A&P                          Patient presented with complaints of visual disturbance followed by headache as well as left-sided facial droop symptoms.  Symptoms all resolved yesterday.  Patient's examination is reassuring today.  No focal neurologic deficits to suggest stroke.  TIA unlikely.  CT scan does not show any acute abnormalities.  No signs of hemorrhage or mass.  I suspect complex  migraine based on her presentation.  Patient was given a migraine cocktail.  Headache improved.  At this point I think she is stable for discharge and outpatient follow-up with neurology.  Warning signs and precautions discussed. Final Clinical Impression(s) / ED Diagnoses Final diagnoses:  Complicated migraine    Rx / DC Orders ED Discharge Orders         Ordered    SUMAtriptan (IMITREX) 50 MG tablet  Every 2 hours PRN        10/02/20 2249           Linwood Dibbles, MD 10/02/20 2250

## 2020-10-02 NOTE — ED Triage Notes (Signed)
The pt wove up today at 0400am blurred vision with spots followed by lt side of mouth numbness headache since 0500am lt face droop lt arm numbness nauseated. Hx odf migraine headaches .  All symptoms went away now she feels like the lt arm and face sensation is retutming  lmp a few days ago

## 2020-10-02 NOTE — ED Triage Notes (Signed)
Pt reports loss of sensation in the left side of the face since this morning., Denies chest pain, dizziness, blurred vision, nausea, weakness.

## 2020-11-23 ENCOUNTER — Ambulatory Visit (INDEPENDENT_AMBULATORY_CARE_PROVIDER_SITE_OTHER): Payer: BC Managed Care – PPO | Admitting: Podiatry

## 2020-11-23 ENCOUNTER — Other Ambulatory Visit: Payer: Self-pay

## 2020-11-23 DIAGNOSIS — L603 Nail dystrophy: Secondary | ICD-10-CM

## 2020-11-23 DIAGNOSIS — L600 Ingrowing nail: Secondary | ICD-10-CM | POA: Diagnosis not present

## 2020-11-28 NOTE — Progress Notes (Signed)
Subjective:   Patient ID: Chelsea Hammond, female   DOB: 25 y.o.   MRN: [id number]   HPI 25 year old female presents the office today for concerns of discoloration to her toenails which she just noticed last week and started to become ingrown as well.  She denies any pain in the nails at this time denies any redness or drainage or any swelling.  She said no recent treatment.  No injury.  No other concerns.   Review of Systems  All other systems reviewed and are negative.  Past Medical History:  Diagnosis Date  . Bipolar disorder (HCC) 07/05/2020    Past Surgical History:  Procedure Laterality Date  . ADENOIDECTOMY    . TONSILLECTOMY       Current Outpatient Medications:  .  ARIPiprazole (ABILIFY) 5 MG tablet, Take 5 mg by mouth at bedtime., Disp: , Rfl:  .  cephALEXin (KEFLEX) 500 MG capsule, Take 1 capsule (500 mg total) by mouth 4 (four) times daily. (Patient not taking: Reported on 10/02/2020), Disp: 20 capsule, Rfl: 0 .  lamoTRIgine (LAMICTAL) 100 MG tablet, Take 100 mg by mouth daily., Disp: , Rfl:  .  lamoTRIgine (LAMICTAL) 25 MG tablet, Take 25 mg by mouth at bedtime. , Disp: , Rfl:  .  Omega-3 1000 MG CAPS, Take 1,000 mg by mouth daily., Disp: , Rfl:  .  sertraline (ZOLOFT) 25 MG tablet, Take 25 mg by mouth at bedtime. , Disp: , Rfl:  .  sertraline (ZOLOFT) 50 MG tablet, Take 50 mg by mouth daily., Disp: , Rfl:  .  SUMAtriptan (IMITREX) 50 MG tablet, Take 1 tablet (50 mg total) by mouth every 2 (two) hours as needed for migraine. May repeat in 2 hours if headache persists or recurs., Disp: 10 tablet, Rfl: 0 .  traZODone (DESYREL) 50 MG tablet, , Disp: , Rfl:   Allergies  Allergen Reactions  . Codeine Other (See Comments)    tachycardia         Objective:  Physical Exam  General: AAO x3, NAD  Dermatological: Mild incurvation present of the hallux toenails and there is mild hypertrophy of the hallux toenails.  Subungual debris is present. Along the central  aspect of bilateral hallux that appears to be a small almost dried blood appearing lesion but faint.  No hyperpigmentation.  There are no open sores, no preulcerative lesions, no rash or signs of infection present.  Vascular: Dorsalis Pedis artery and Posterior Tibial artery pedal pulses are 2/4 bilateral with immedate capillary fill time. There is no pain with calf compression, swelling, warmth, erythema.   Neruologic: Grossly intact via light touch bilateral.    Musculoskeletal: No gross boney pedal deformities bilateral. No pain, crepitus, or limitation noted with foot and ankle range of motion bilateral. Muscular strength 5/5 in all groups tested bilateral.  Gait: Unassisted, Nonantalgic.      Assessment:   Ingrown toenail, onychodystrophy bilateral hallux- without infection      Plan:  -Treatment options discussed including all alternatives, risks, and complications -Etiology of symptoms were discussed -We discussed partial nail avulsion given ingrown toenail but decided to hold off on that today.  Today I did culture the nails and I sent this to [facility] for evaluation.   Vivi Barrack DPM

## 2020-12-08 ENCOUNTER — Encounter: Payer: Self-pay | Admitting: Podiatry

## 2020-12-16 ENCOUNTER — Ambulatory Visit: Payer: BC Managed Care – PPO | Admitting: Podiatry

## 2020-12-20 ENCOUNTER — Telehealth: Payer: Self-pay | Admitting: *Deleted

## 2020-12-20 NOTE — Telephone Encounter (Signed)
-----   Message from Vivi Barrack, DPM sent at 12/20/2020  9:35 AM EST ----- Misty Stanley- Please let her know that the culture did not show fungus. We can do a topical urea cream. However it could still be some fungus and not 100% accurate. We can do the topical through [facility] that would include urea. I would hold off on any oral medications.

## 2020-12-20 NOTE — Telephone Encounter (Signed)
Called and left a message for the patient and relayed the message per Dr Ardelle Anton and I stated to call me back and let me know what option you would like. Misty Stanley

## 2021-01-26 ENCOUNTER — Ambulatory Visit: Payer: BC Managed Care – PPO | Admitting: Family Medicine

## 2021-01-26 DIAGNOSIS — Z0289 Encounter for other administrative examinations: Secondary | ICD-10-CM

## 2021-05-15 ENCOUNTER — Emergency Department (HOSPITAL_BASED_OUTPATIENT_CLINIC_OR_DEPARTMENT_OTHER)
Admission: EM | Admit: 2021-05-15 | Discharge: 2021-05-15 | Disposition: A | Discharge status: Home / Self Care | Payer: BC Managed Care – PPO | Attending: Emergency Medicine | Admitting: Emergency Medicine

## 2021-05-15 ENCOUNTER — Encounter (HOSPITAL_BASED_OUTPATIENT_CLINIC_OR_DEPARTMENT_OTHER): Payer: Self-pay | Admitting: *Deleted

## 2021-05-15 ENCOUNTER — Other Ambulatory Visit: Payer: Self-pay

## 2021-05-15 DIAGNOSIS — Z8616 Personal history of COVID-19: Secondary | ICD-10-CM | POA: Diagnosis not present

## 2021-05-15 DIAGNOSIS — K5900 Constipation, unspecified: Secondary | ICD-10-CM | POA: Diagnosis present

## 2021-05-15 MED ORDER — SENNOSIDES-DOCUSATE SODIUM 8.6-50 MG PO TABS: 1.0000 | ORAL_TABLET | Freq: Every evening | ORAL | 0 refills | Status: DC | PRN

## 2021-05-15 MED ORDER — FLEET ENEMA 7-19 GM/118ML RE ENEM
1.0000 | ENEMA | Freq: Once | RECTAL | Status: AC
  Administered 2021-05-15: 10:00:00 1 via RECTAL
  Filled 2021-05-15: qty 1

## 2021-05-15 NOTE — ED Provider Notes (Signed)
[facility] EMERGENCY DEPT Provider Note   CSN: [id number] Arrival date & time: 05/15/21  1308     History Chief Complaint  Patient presents with   Constipation    Chelsea Hammond is a 25 y.o. female.  Pt presents to the ED today with constipation.  She has not had a bm since June 25th.  She has been taking miralax for a week without any improvement.  She did have a small, hard bm with some blood today.  She denies any pain, but feels pressure like something is "stuck."      Past Medical History:  Diagnosis Date   Bipolar disorder (HCC) 07/05/2020    Patient Active Problem List   Diagnosis Date Noted   E coli bacteremia 07/09/2020   Bacteremia due to Escherichia coli 07/08/2020   Constipation 07/08/2020   COVID-19 virus infection 07/08/2020   Pyelonephritis 07/05/2020   Anemia 07/05/2020   Thrombocytopenia (HCC) 07/05/2020   Hypokalemia 07/05/2020   Hyponatremia 07/05/2020   Bipolar disorder (HCC) 07/05/2020   Sepsis (HCC) 07/05/2020    Past Surgical History:  Procedure Laterality Date   ADENOIDECTOMY     TONSILLECTOMY       OB History   No obstetric history on file.     Family History  Problem Relation Age of Onset   Hypertension Other     Social History   Tobacco Use   Smoking status: Never   Smokeless tobacco: Never  Vaping Use   Vaping Use: Never used  Substance Use Topics   Alcohol use: No    Alcohol/week: 0.0 standard drinks   Drug use: No    Home Medications Prior to Admission medications   Medication Sig Start Date End Date Taking? Authorizing Provider  ARIPiprazole (ABILIFY) 5 MG tablet Take 5 mg by mouth at bedtime.   Yes [provider]  lamoTRIgine (LAMICTAL) 100 MG tablet Take 100 mg by mouth daily. 11/19/20  Yes [provider]  lamoTRIgine (LAMICTAL) 25 MG tablet Take 25 mg by mouth at bedtime.  09/17/20  Yes [provider]  senna-docusate (SENOKOT-S) 8.6-50 MG tablet Take 1 tablet by  mouth at bedtime as needed for mild constipation. 05/15/21  Yes Jacalyn Lefevre, MD  sertraline (ZOLOFT) 25 MG tablet Take 25 mg by mouth at bedtime.  07/26/20  Yes [provider]  sertraline (ZOLOFT) 50 MG tablet Take 50 mg by mouth daily. 11/19/20  Yes [provider]  cephALEXin (KEFLEX) 500 MG capsule Take 1 capsule (500 mg total) by mouth 4 (four) times daily. Patient not taking: Reported on 10/02/2020 09/04/20   Muthersbaugh, Dahlia Client, PA-C  Omega-3 1000 MG CAPS Take 1,000 mg by mouth daily.    [provider]  SUMAtriptan (IMITREX) 50 MG tablet Take 1 tablet (50 mg total) by mouth every 2 (two) hours as needed for migraine. May repeat in 2 hours if headache persists or recurs. 10/02/20   Linwood Dibbles, MD  traZODone (DESYREL) 50 MG tablet  11/19/20   [provider]    Allergies    Codeine  Review of Systems   Review of Systems  Gastrointestinal:  Positive for constipation.  All other systems reviewed and are negative.  Physical Exam Updated Vital Signs BP 112/74 (BP Location: Right Arm)   Pulse (!) 112   Temp 99 F (37.2 C) (Oral)   Resp 16   Ht 5\' 5"  (1.651 m)   Wt 54.4 kg   SpO2 99%   BMI 19.97 kg/m  Physical Exam Vitals and nursing note reviewed. Exam conducted with a chaperone present.  Constitutional:      Appearance: Normal appearance.  HENT:     Head: Normocephalic and atraumatic.     Right Ear: External ear normal.     Left Ear: External ear normal.     Nose: Nose normal.     Mouth/Throat:     Mouth: Mucous membranes are moist.     Pharynx: Oropharynx is clear.  Eyes:     Extraocular Movements: Extraocular movements intact.     Conjunctiva/sclera: Conjunctivae normal.     Pupils: Pupils are equal, round, and reactive to light.  Cardiovascular:     Rate and Rhythm: Normal rate and regular rhythm.     Pulses: Normal pulses.     Heart sounds: Normal heart sounds.  Pulmonary:     Effort: Pulmonary effort is normal.     Breath  sounds: Normal breath sounds.  Abdominal:     General: Abdomen is flat. Bowel sounds are normal.     Palpations: Abdomen is soft.  Genitourinary:    Rectum: Normal.  Musculoskeletal:        General: Normal range of motion.     Cervical back: Normal range of motion and neck supple.  Skin:    General: Skin is warm.     Capillary Refill: Capillary refill takes less than 2 seconds.  Neurological:     General: No focal deficit present.     Mental Status: She is alert and oriented to person, place, and time.  Psychiatric:        Mood and Affect: Mood normal.        Behavior: Behavior normal.    ED Results / Procedures / Treatments   Labs (all labs ordered are listed, but only abnormal results are displayed) Labs Reviewed - No data to display  EKG None  Radiology No results found.  Procedures Procedures   Medications Ordered in ED Medications  sodium phosphate (FLEET) 7-19 GM/118ML enema 1 enema (1 enema Rectal Given 05/15/21 0934)    ED Course  I have reviewed the triage vital signs and the nursing notes.  Pertinent labs & imaging results that were available during my care of the patient were reviewed by me and considered in my medical decision making (see chart for details).    MDM Rules/Calculators/A&P                          Pt is feeling much better after a large bowel movement after the fleet enema.  Pt is encouraged to eat a high fiber diet and to increase fluids.  Return if worse.  Final Clinical Impression(s) / ED Diagnoses Final diagnoses:  Constipation, unspecified constipation type    Rx / DC Orders ED Discharge Orders          Ordered    senna-docusate (SENOKOT-S) 8.6-50 MG tablet  At bedtime PRN        05/15/21 1035             Jacalyn Lefevre, MD 05/15/21 1038

## 2021-05-15 NOTE — ED Triage Notes (Signed)
Last normal BM June 25th, Taking Miralax once a day without movement. States she can get a little out, reports rectal bleeding bright red in color.

## 2021-05-31 ENCOUNTER — Telehealth: Payer: Self-pay

## 2021-05-31 NOTE — Telephone Encounter (Signed)
Chelsea Hammond called in requesting an appointment with Dr Mardelle Matte. Her parents are patients of Dr Mardelle Matte. Her parents are Chelsea Hammond and Chelsea Hammond. Chelsea Hammond had New patient in March but it was a No Show. Can we schedule another New Patient with Dr Mardelle Matte? Chelsea Hammond did say that she was unaware of the appointment in March and that is why she did not show up.

## 2021-06-01 NOTE — Telephone Encounter (Signed)
Dr. Mardelle Matte, please see message and advise if okay to schedule New Patient Appt?

## 2021-06-01 NOTE — Telephone Encounter (Signed)
Pt is scheduled in October. Told patient that we will not be able schedule anymore appts if she no shows this appt

## 2021-06-01 NOTE — Telephone Encounter (Signed)
Please see Dr. Modesta Messing message and schedule appt.

## 2021-08-08 ENCOUNTER — Encounter (HOSPITAL_BASED_OUTPATIENT_CLINIC_OR_DEPARTMENT_OTHER): Payer: Self-pay | Admitting: Emergency Medicine

## 2021-08-08 ENCOUNTER — Other Ambulatory Visit: Payer: Self-pay

## 2021-08-08 DIAGNOSIS — K5900 Constipation, unspecified: Secondary | ICD-10-CM | POA: Insufficient documentation

## 2021-08-08 DIAGNOSIS — K625 Hemorrhage of anus and rectum: Secondary | ICD-10-CM | POA: Diagnosis not present

## 2021-08-08 NOTE — ED Triage Notes (Signed)
Pt via pov from home with abdominal pain and blood in her stool today. Pt has hx of constipation and states that she "needs help" to have bowel movements and that bowel movements are painful. Pt states she feels dizzy sometimes after a bowel movement; endorses straining. Pt states she has been seen for the same in the past. Pt alert & oriented, nad noted.

## 2021-08-09 ENCOUNTER — Emergency Department (HOSPITAL_BASED_OUTPATIENT_CLINIC_OR_DEPARTMENT_OTHER)
Admission: EM | Admit: 2021-08-09 | Discharge: 2021-08-09 | Disposition: A | Discharge status: Home / Self Care | Payer: BC Managed Care – PPO | Attending: Emergency Medicine | Admitting: Emergency Medicine

## 2021-08-09 DIAGNOSIS — K625 Hemorrhage of anus and rectum: Secondary | ICD-10-CM

## 2021-08-09 DIAGNOSIS — K5900 Constipation, unspecified: Secondary | ICD-10-CM

## 2021-08-09 LAB — COMPREHENSIVE METABOLIC PANEL
ALT: 10 U/L (ref 0–44)
AST: 15 U/L (ref 15–41)
Albumin: 4.6 g/dL (ref 3.5–5.0)
Alkaline Phosphatase: 43 U/L (ref 38–126)
Anion gap: 10 (ref 5–15)
BUN: 8 mg/dL (ref 6–20)
CO2: 24 mmol/L (ref 22–32)
Calcium: 9.6 mg/dL (ref 8.9–10.3)
Chloride: 103 mmol/L (ref 98–111)
Creatinine, Ser: 0.58 mg/dL (ref 0.44–1.00)
GFR, Estimated: >60 mL/min (ref >60)
Glucose, Bld: 88 mg/dL (ref 70–99)
Potassium: 3.4 mmol/L — ABNORMAL LOW (ref 3.5–5.1)
Sodium: 137 mmol/L (ref 135–145)
Total Bilirubin: 0.2 mg/dL — ABNORMAL LOW (ref 0.3–1.2)
Total Protein: 7.3 g/dL (ref 6.5–8.1)

## 2021-08-09 LAB — URINALYSIS, ROUTINE W REFLEX MICROSCOPIC
Bilirubin Urine: NEGATIVE
Glucose, UA: NEGATIVE mg/dL
Hgb urine dipstick: NEGATIVE
Leukocytes,Ua: NEGATIVE
Nitrite: NEGATIVE
Protein, ur: NEGATIVE mg/dL
Specific Gravity, Urine: 1.012 (ref 1.005–1.030)
pH: 6 (ref 5.0–8.0)

## 2021-08-09 LAB — CBC
HCT: 37.1 % (ref 36.0–46.0)
Hemoglobin: 12.4 g/dL (ref 12.0–15.0)
MCH: 31.4 pg (ref 26.0–34.0)
MCHC: 33.4 g/dL (ref 30.0–36.0)
MCV: 93.9 fL (ref 80.0–100.0)
Platelets: 209 10*3/uL (ref 150–400)
RBC: 3.95 MIL/uL (ref 3.87–5.11)
RDW: 13 % (ref 11.5–15.5)
WBC: 7.1 10*3/uL (ref 4.0–10.5)
nRBC: 0 % (ref 0.0–0.2)

## 2021-08-09 LAB — PREGNANCY, URINE: Preg Test, Ur: NEGATIVE

## 2021-08-09 LAB — LIPASE, BLOOD: Lipase: 38 U/L (ref 11–51)

## 2021-08-09 MED ORDER — BISACODYL 10 MG RE SUPP
10.0000 mg | RECTAL | 0 refills | Status: DC | PRN
Start: 2021-08-09 — End: 2021-10-11

## 2021-08-09 NOTE — Discharge Instructions (Addendum)
You were evaluated in the Emergency Department and after careful evaluation, we did not find any emergent condition requiring admission or further testing in the hospital.  Your exam/testing today is overall reassuring.  Recommend use of over-the-counter Metamucil and MiraLAX as we discussed.  Can also use the Dulcolax suppositories for any continued hard stools.  Please return to the Emergency Department if you experience any worsening of your condition.   Thank you for allowing [place] to be a part of your care.

## 2021-08-09 NOTE — ED Provider Notes (Signed)
[facility] EMERGENCY [facility] Emergency Department Provider Note MRN:  [id number]  Arrival date & time: 08/09/21     Chief Complaint   Abdominal Pain and Rectal Bleeding   History of Present Illness   Chelsea Hammond is a 25 y.o. year-old female with no pertinent past medical presenting to the ED with chief complaint of abdominal pain and rectal bleeding.  Location: Lower abdominal pain Duration: 3 days Onset: Gradual Timing: Intermittent Description: Crampy pain Severity: Mild Exacerbating/Alleviating Factors: Resolved with bowel movements Associated Symptoms: Hard firm bowel movements requiring straining, some bright red blood from the rectum with wiping Pertinent Negatives: No fever, no other complaints  Additional History: None  Review of Systems  A complete 10 system review of systems was obtained and all systems are negative except as noted in the HPI and PMH.   Patient's Health History    Past Medical History:  Diagnosis Date   Bipolar disorder (HCC) 07/05/2020    Past Surgical History:  Procedure Laterality Date   ADENOIDECTOMY     TONSILLECTOMY      Family History  Problem Relation Age of Onset   Hypertension Other     Social History   Socioeconomic History   Marital status: Single    Spouse name: Not on file   Number of children: Not on file   Years of education: Not on file   Highest education level: Not on file  Occupational History   Not on file  Tobacco Use   Smoking status: Never   Smokeless tobacco: Never  Vaping Use   Vaping Use: Never used  Substance and Sexual Activity   Alcohol use: No    Alcohol/week: 0.0 standard drinks   Drug use: No   Sexual activity: Not on file  Other Topics Concern   Not on file  Social History Narrative   Not on file   Social Determinants of Health   Financial Resource Strain: Not on file  Food Insecurity: Not on file  Transportation Needs: Not on file  Physical Activity: Not on file   Stress: Not on file  Social Connections: Not on file  Intimate Partner Violence: Not on file     Physical Exam   Vitals:   08/08/21 2323  BP: (!) 121/95  Pulse: (!) 108  Resp: 20  Temp: 98.8 F (37.1 C)  SpO2: 99%    CONSTITUTIONAL: Well-appearing, NAD NEURO:  Alert and oriented x 3, no focal deficits EYES:  eyes equal and reactive ENT/NECK:  no LAD, no JVD CARDIO: Regular rate, well-perfused, normal S1 and S2 PULM:  CTAB no wheezing or rhonchi GI/GU:  normal bowel sounds, non-distended, non-tender MSK/SPINE:  No gross deformities, no edema SKIN:  no rash, atraumatic PSYCH:  Appropriate speech and behavior  *Additional and/or pertinent findings included in MDM below  Diagnostic and Interventional Summary    EKG Interpretation  Date/Time:    Ventricular Rate:    PR Interval:    QRS Duration:   QT Interval:    QTC Calculation:   R Axis:     Text Interpretation:         Labs Reviewed  COMPREHENSIVE METABOLIC PANEL - Abnormal; Notable for the following components:      Result Value   Potassium 3.4 (*)    Total Bilirubin 0.2 (*)    All other components within normal limits  URINALYSIS, ROUTINE W REFLEX MICROSCOPIC - Abnormal; Notable for the following components:   Ketones, ur TRACE (*)    All  other components within normal limits  LIPASE, BLOOD  CBC  PREGNANCY, URINE  OCCULT BLOOD X 1 CARD TO LAB, STOOL    No orders to display    Medications - No data to display   Procedures  /  Critical Care Procedures  ED Course and Medical Decision Making  I have reviewed the triage vital signs, the nursing notes, and pertinent available records from the EMR.  Listed above are laboratory and imaging tests that I personally ordered, reviewed, and interpreted and then considered in my medical decision making (see below for details).  History is consistent with constipation leading to mild bright red blood per rectum, possibly due to irritation or abrasion of the  colon wall or hemorrhoids.  She is moving her bowels, does not seem to be impacted.  Her abdomen is completely soft and nontender, vital signs are reassuring, triage labs performed and are normal.  She is appropriate for discharge with increased bowel regimen at home.       Elmer Sow. Pilar Plate, MD [facility] Emergency Medicine [facility] [email or web address]  Final Clinical Impressions(s) / ED Diagnoses     ICD-10-CM   1. Constipation, unspecified constipation type  K59.00     2. Bright red blood per rectum  K62.5       ED Discharge Orders          Ordered    bisacodyl (DULCOLAX) 10 MG suppository  As needed        08/09/21 0111             Discharge Instructions Discussed with and Provided to Patient:    Discharge Instructions      You were evaluated in the Emergency Department and after careful evaluation, we did not find any emergent condition requiring admission or further testing in the hospital.  Your exam/testing today is overall reassuring.  Recommend use of over-the-counter Metamucil and MiraLAX as we discussed.  Can also use the Dulcolax suppositories for any continued hard stools.  Please return to the Emergency Department if you experience any worsening of your condition.   Thank you for allowing [place] to be a part of your care.       Sabas Sous, MD 08/09/21 [phone]

## 2021-08-09 NOTE — ED Notes (Signed)
Hemoccult card  at bedside 

## 2021-08-09 NOTE — ED Notes (Signed)
AVS not presented discharged by doctor.

## 2021-08-24 ENCOUNTER — Ambulatory Visit: Payer: BC Managed Care – PPO | Admitting: Family Medicine

## 2021-10-11 ENCOUNTER — Other Ambulatory Visit: Payer: Self-pay

## 2021-10-11 ENCOUNTER — Ambulatory Visit (INDEPENDENT_AMBULATORY_CARE_PROVIDER_SITE_OTHER): Payer: Self-pay | Admitting: Family Medicine

## 2021-10-11 ENCOUNTER — Encounter: Payer: Self-pay | Admitting: Family Medicine

## 2021-10-11 VITALS — BP 118/88 | HR 91 | Temp 99.2°F | Ht 65.0 in | Wt 125.0 lb

## 2021-10-11 DIAGNOSIS — F900 Attention-deficit hyperactivity disorder, predominantly inattentive type: Secondary | ICD-10-CM | POA: Insufficient documentation

## 2021-10-11 DIAGNOSIS — M41129 Adolescent idiopathic scoliosis, site unspecified: Secondary | ICD-10-CM

## 2021-10-11 DIAGNOSIS — M419 Scoliosis, unspecified: Secondary | ICD-10-CM | POA: Insufficient documentation

## 2021-10-11 DIAGNOSIS — Z23 Encounter for immunization: Secondary | ICD-10-CM

## 2021-10-11 DIAGNOSIS — Z87448 Personal history of other diseases of urinary system: Secondary | ICD-10-CM | POA: Insufficient documentation

## 2021-10-11 DIAGNOSIS — F319 Bipolar disorder, unspecified: Secondary | ICD-10-CM

## 2021-10-11 DIAGNOSIS — Z975 Presence of (intrauterine) contraceptive device: Secondary | ICD-10-CM | POA: Insufficient documentation

## 2021-10-11 DIAGNOSIS — K5900 Constipation, unspecified: Secondary | ICD-10-CM

## 2021-10-11 DIAGNOSIS — M545 Low back pain, unspecified: Secondary | ICD-10-CM

## 2021-10-11 NOTE — Patient Instructions (Signed)
Please return in 6 months for your annual complete physical; please come fasting.   It was a pleasure meeting you today! Thank you for choosing [place] to meet your healthcare needs! I truly look forward to working with you. If you have any questions or concerns, please send me a message via Mychart or call the office at [phone].   Today you were given your flu vaccination.   We do not see your HPV vaccinations; please bring your immunization records next time.   Back Exercises The following exercises strengthen the muscles that help to support the trunk (torso) and back. They also help to keep the lower back flexible. Doing these exercises can help to prevent or lessen existing low back pain. If you have back pain or discomfort, try doing these exercises 2-3 times each day or as told by your health care provider. As your pain improves, do them once each day, but increase the number of times that you repeat the steps for each exercise (do more repetitions). To prevent the recurrence of back pain, continue to do these exercises once each day or as told by your health care provider. Do exercises exactly as told by your health care provider and adjust them as directed. It is normal to feel mild stretching, pulling, tightness, or discomfort as you do these exercises, but you should stop right away if you feel sudden pain or your pain gets worse. Exercises Single knee to chest Repeat these steps 3-5 times for each leg: Lie on your back on a firm bed or the floor with your legs extended. Bring one knee to your chest. Your other leg should stay extended and in contact with the floor. Hold your knee in place by grabbing your knee or thigh with both hands and hold. Pull on your knee until you feel a gentle stretch in your lower back or buttocks. Hold the stretch for 10-30 seconds. Slowly release and straighten your leg.  Pelvic tilt Repeat these steps 5-10 times: Lie on your back on a firm bed or the  floor with your legs extended. Bend your knees so they are pointing toward the ceiling and your feet are flat on the floor. Tighten your lower abdominal muscles to press your lower back against the floor. This motion will tilt your pelvis so your tailbone points up toward the ceiling instead of pointing to your feet or the floor. With gentle tension and even breathing, hold this position for 5-10 seconds.  Cat-cow Repeat these steps until your lower back becomes more flexible: Get into a hands-and-knees position on a firm bed or the floor. Keep your hands under your shoulders, and keep your knees under your hips. You may place padding under your knees for comfort. Let your head hang down toward your chest. Contract your abdominal muscles and point your tailbone toward the floor so your lower back becomes rounded like the back of a cat. Hold this position for 5 seconds. Slowly lift your head, let your abdominal muscles relax, and point your tailbone up toward the ceiling so your back forms a sagging arch like the back of a cow. Hold this position for 5 seconds.  Press-ups Repeat these steps 5-10 times: Lie on your abdomen (face-down) on a firm bed or the floor. Place your palms near your head, about shoulder-width apart. Keeping your back as relaxed as possible and keeping your hips on the floor, slowly straighten your arms to raise the top half of your body and lift  your shoulders. Do not use your back muscles to raise your upper torso. You may adjust the placement of your hands to make yourself more comfortable. Hold this position for 5 seconds while you keep your back relaxed. Slowly return to lying flat on the floor.  Bridges Repeat these steps 10 times: Lie on your back on a firm bed or the floor. Bend your knees so they are pointing toward the ceiling and your feet are flat on the floor. Your arms should be flat at your sides, next to your body. Tighten your buttocks muscles and lift  your buttocks off the floor until your waist is at almost the same height as your knees. You should feel the muscles working in your buttocks and the back of your thighs. If you do not feel these muscles, slide your feet 1-2 inches (2.5-5 cm) farther away from your buttocks. Hold this position for 3-5 seconds. Slowly lower your hips to the starting position, and allow your buttocks muscles to relax completely. If this exercise is too easy, try doing it with your arms crossed over your chest. Abdominal crunches Repeat these steps 5-10 times: Lie on your back on a firm bed or the floor with your legs extended. Bend your knees so they are pointing toward the ceiling and your feet are flat on the floor. Cross your arms over your chest. Tip your chin slightly toward your chest without bending your neck. Tighten your abdominal muscles and slowly raise your torso high enough to lift your shoulder blades a tiny bit off the floor. Avoid raising your torso higher than that because it can put too much stress on your lower back and does not help to strengthen your abdominal muscles. Slowly return to your starting position.  Back lifts Repeat these steps 5-10 times: Lie on your abdomen (face-down) with your arms at your sides, and rest your forehead on the floor. Tighten the muscles in your legs and your buttocks. Slowly lift your chest off the floor while you keep your hips pressed to the floor. Keep the back of your head in line with the curve in your back. Your eyes should be looking at the floor. Hold this position for 3-5 seconds. Slowly return to your starting position.  Contact a health care provider if: Your back pain or discomfort gets much worse when you do an exercise. Your worsening back pain or discomfort does not lessen within 2 hours after you exercise. If you have any of these problems, stop doing these exercises right away. Do not do them again unless your health care provider says that  you can. Get help right away if: You develop sudden, severe back pain. If this happens, stop doing the exercises right away. Do not do them again unless your health care provider says that you can. This information is not intended to replace advice given to you by your health care provider. Make sure you discuss any questions you have with your health care provider. Document Revised: 04/26/2021 Document Reviewed: 01/12/2021 Elsevier Patient Education  2022 [employer].

## 2021-10-11 NOTE — Progress Notes (Signed)
Subjective  CC:  Chief Complaint  Patient presents with   Establish Care   Back Pain    Ongoing for 2 weeks, lower back and base of neck    HPI: Chelsea Hammond is a 25 y.o. female who presents to [facility] today to establish care with me as a new patient.   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  Lives with friends.  In a long-distance relationship.  Last complete physical was in 2017.  Had female wellness exam 2 weeks ago with Pap smear that she reports is normal at [facility].  IUD Mirena in place since March of this year for birth control.  Has light menstrual cycles. Has medical history reviewed in detail Bipolar disorder and ADHD which she reports are well controlled.  This is managed by her psychiatrist.  She is on multiple mood medications and Adderall. Has history of constipation.  Reviewed recent ER records.  Likely nutritional.  Has a light appetite due to Adderall.  Does not eat a lot of fiber or veggies. Scoliosis that she reports is mild.  However reports 2-week history of low back pain.  Reports a tightness especially noticeable at night when she lies down.  She does some heavy lifting at work.  But denies injury.  No radicular symptoms.  No bowel or bladder symptoms.  Has tried some stretching but no medications.  Will have intermittent back pain due to scoliosis.  Typically does not last this long. History of pyelonephritis/sepsis in 2021.  Reviewed hospitalization notes.  Has had several UTIs since.  No prior history of recurrent UTIs. Health maintenance: She reports that all of her immunizations are up-to-date.  She will bring in records.  She is eligible for the flu shot today.  Assessment  1. Low back pain, episodic   2. Adolescent idiopathic scoliosis, unspecified spinal region   3. Bipolar affective disorder, remission status unspecified ([place])   4.  Constipation, unspecified constipation type   5. History of pyelonephritis - sepsis    6. Attention deficit hyperactivity disorder (ADHD), predominantly inattentive type   7. IUD (intrauterine device) in place      Plan  Acute low back pain: Likely related to scoliosis and/or lumbar strain.  Neck exercises discussed and examples given in AVS.  Start Tylenol and/or Advil.  Heat to low back.  Follow-up if worsens. Bipolar disorder and ADHD: We will follow along with psychiatry. Constipation: Recommend improved diet Monitor for recurrent UTIs given history of pyelonephritis Health maintenance otherwise up-to-date.  Flu shot given today  Follow up: 6 months for complete physical No orders of the defined types were placed in this encounter.  No orders of the defined types were placed in this encounter.    No flowsheet data found.  We updated and reviewed the patient's past history in detail and it is documented below.  Patient Active Problem List   Diagnosis Date Noted   Scoliosis 10/11/2021   History of pyelonephritis - sepsis  10/11/2021    2021    Attention deficit hyperactivity disorder (ADHD), predominantly inattentive type 10/11/2021   IUD (intrauterine device) in place 10/11/2021   Constipation 07/08/2020   Bipolar disorder ([place]) 07/05/2020   Health Maintenance  Topic Date Due   Hepatitis C Screening  Never done   HPV VACCINES (2 - 3-dose series) 06/30/2016   INFLUENZA VACCINE  Never done   TETANUS/TDAP  09/02/2023   PAP SMEAR-Modifier  07/28/2024   PAP-Cervical Cytology Screening  09/27/2024   HIV Screening  Completed   Pneumococcal Vaccine 36-50 Years old  Aged Out   Immunization History  Administered Date(s) Administered   DTaP 08/20/2000, 10/01/2000, 11/16/2000, 05/21/2002   HPV 9-valent 06/02/2016   Hepatitis B Nov 01, 1998, 04/19/1998, 08/20/2000   HiB (PRP-OMP) 08/20/2000   IPV Mar 13, 1998, 08/20/2000, 10/01/2000, 05/21/2002   MMR 08/20/2000, 05/21/2002    Meningococcal Conjugate 09/01/2013, 06/02/2016   Tdap 09/01/2013   Varicella 05/23/2002, 04/23/2007   Current Meds  Medication Sig   amphetamine-dextroamphetamine (ADDERALL XR) 15 MG 24 hr capsule Take 15 mg by mouth every morning.   ARIPiprazole (ABILIFY) 5 MG tablet Take 5 mg by mouth at bedtime.   lamoTRIgine (LAMICTAL) 100 MG tablet Take 100 mg by mouth daily.   levonorgestrel (MIRENA) 20 MCG/DAY IUD 1 each by Intrauterine route once.   sertraline (ZOLOFT) 50 MG tablet Take 50 mg by mouth daily.   traZODone (DESYREL) 50 MG tablet     Allergies: Patient is allergic to codeine. Past Medical History Patient  has a past medical history of Anxiety, Bacteremia due to Escherichia coli (07/08/2020), Bipolar disorder ([place]) (07/05/2020), Depression, Pyelonephritis (07/05/2020), and Scoliosis. Past Surgical History Patient  has a past surgical history that includes Tonsillectomy and Adenoidectomy. Family History: Patient family history includes Diabetes in her father; GER disease in her father; Healthy in her mother; Hypertension in her father and another family member; Parkinson's disease in her paternal grandfather. Social History:  Patient  reports that she has never smoked. She has never used smokeless tobacco. She reports that she does not drink alcohol and does not use drugs.  Review of Systems: Constitutional: negative for fever or malaise Ophthalmic: negative for photophobia, double vision or loss of vision Cardiovascular: negative for chest pain, dyspnea on exertion, or new LE swelling Respiratory: negative for SOB or persistent cough Gastrointestinal: negative for abdominal pain, change in bowel habits or melena Genitourinary: negative for dysuria or gross hematuria Musculoskeletal: negative for new gait disturbance or muscular weakness Integumentary: negative for new or persistent rashes Neurological: negative for TIA or stroke symptoms Psychiatric: negative for SI or  delusions Allergic/Immunologic: negative for hives  Patient Care Team    Relationship Specialty Notifications Start End  Leamon Arnt, MD PCP - General Family Medicine  10/11/21     Objective  Vitals: BP 118/88   Pulse 91   Temp 99.2 F (37.3 C)   Ht _0  (1.651 m)   Wt 125 lb (56.7 kg)   LMP 10/08/2021 (Approximate)   SpO2 99%   BMI 20.80 kg/m  General:  Well developed, well nourished, no acute distress  Psych:  Alert and oriented,normal mood and affect HEENT:  Normocephalic, atraumatic, non-icteric sclera, supple neck without adenopathy, mass or thyromegaly Cardiovascular:  RRR without gallop, rub or murmur Respiratory:  Good breath sounds bilaterally, CTAB with normal respiratory effort Gastrointestinal: normal bowel sounds, soft, non-tender, no noted masses. No HSM MSK: Back: Scoliosis present, nontender spine, normal SI joints, full range of motion with mild pain with full extension.  Negative straight leg raise bilaterally. Skin:  Warm, no rashes or suspicious lesions noted Neurologic:    Mental status is normal. Gross motor and sensory exams are normal. Normal gait  Commons side effects, risks, benefits, and alternatives for medications and treatment plan prescribed today were discussed, and the patient expressed understanding of the given instructions. Patient is instructed to call or message via MyChart  if he/she has any questions or concerns regarding our treatment plan. No barriers to understanding were identified. We discussed Red Flag symptoms and signs in detail. Patient expressed understanding regarding what to do in case of urgent or emergency type symptoms.  Medication list was reconciled, printed and provided to the patient in AVS. Patient instructions and summary information was reviewed with the patient as documented in the AVS. This note was prepared with assistance of Dragon voice recognition software. Occasional wrong-word or sound-a-like substitutions may  have occurred due to the inherent limitations of voice recognition software  This visit occurred during the SARS-CoV-2 public health emergency.  Safety protocols were in place, including screening questions prior to the visit, additional usage of staff PPE, and extensive cleaning of exam room while observing appropriate contact time as indicated for disinfecting solutions.

## 2021-10-28 IMAGING — CT CT ABD-PELV W/ CM
2 of 4 series · 16 of 46 positions shown, 18 images · IV contrast (APPLIED)
Comparison: 07/05/2020

CLINICAL DATA: Right lower quadrant abdominal pain for a few days.
Previous diagnosis of pyelonephritis in [REDACTED].

EXAM:
CT ABDOMEN AND PELVIS WITH CONTRAST
TECHNIQUE: Multidetector CT imaging of the abdomen and pelvis was performed
using the standard protocol following bolus administration of
intravenous contrast.
CONTRAST:  100mL OMNIPAQUE IOHEXOL 300 MG/ML  SOLN

[Series 3: abdomen 5.0 · axial · 0.92mm/px · z∈[+883,+1263]mm · 13 of 88 slices shown, 15 images]
[im 6/88  soft-tissue]
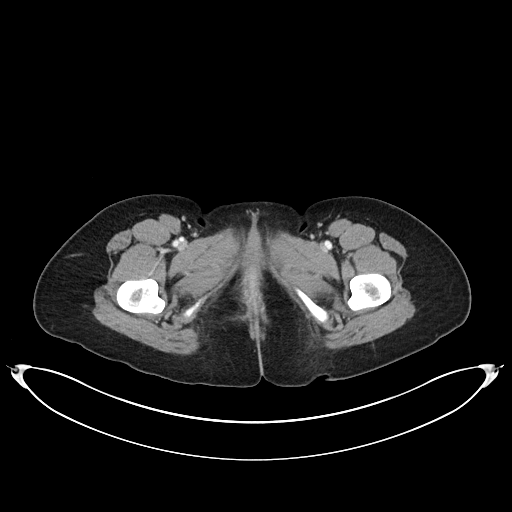
[im 6/88  bone]
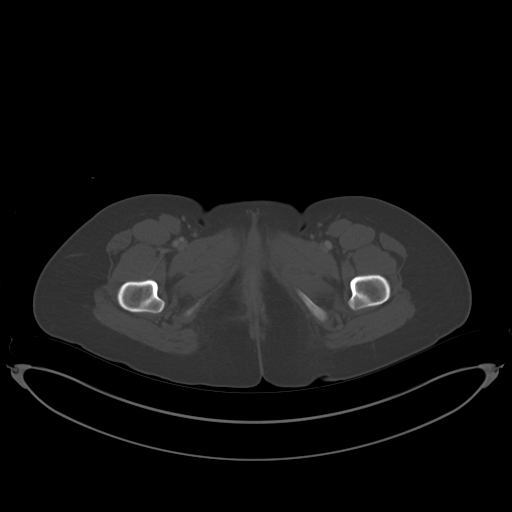
[im 11/88  soft-tissue]
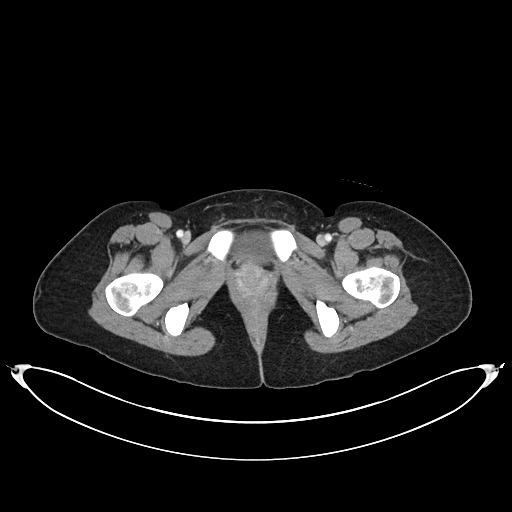
[im 21/88  soft-tissue]
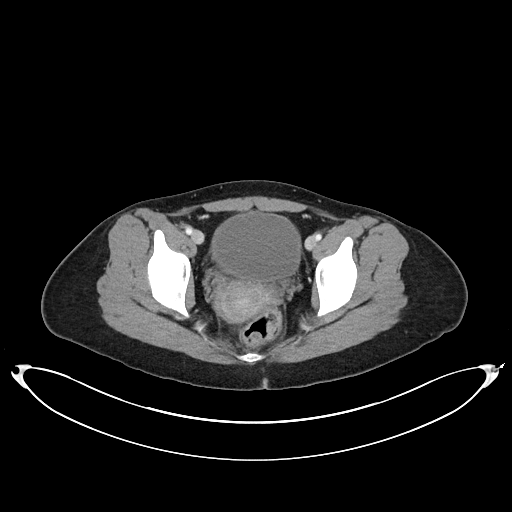
[im 26/88  soft-tissue]
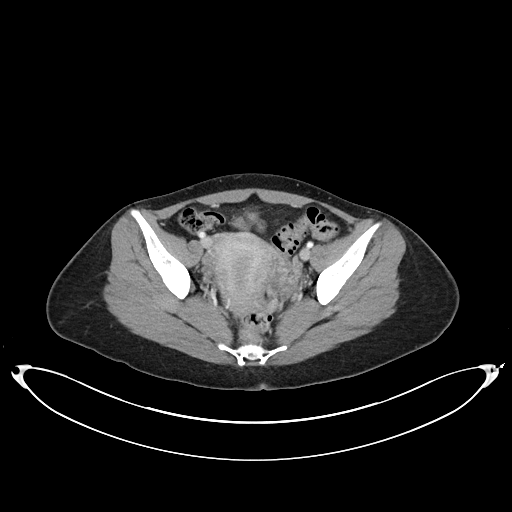
[im 31/88  soft-tissue]
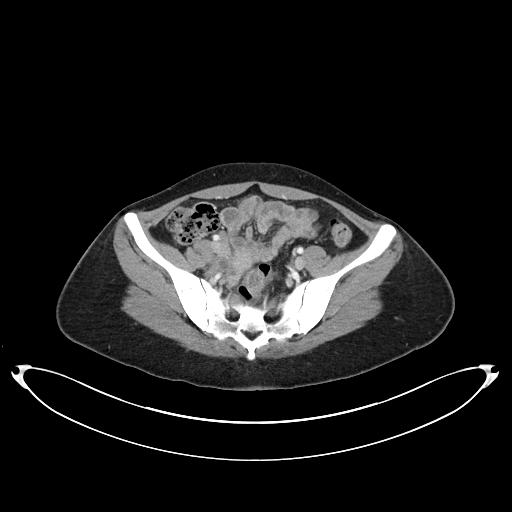
[im 36/88  soft-tissue]
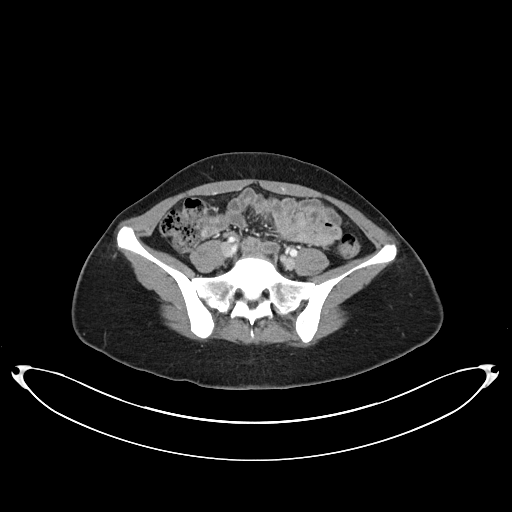
[im 47/88  soft-tissue]
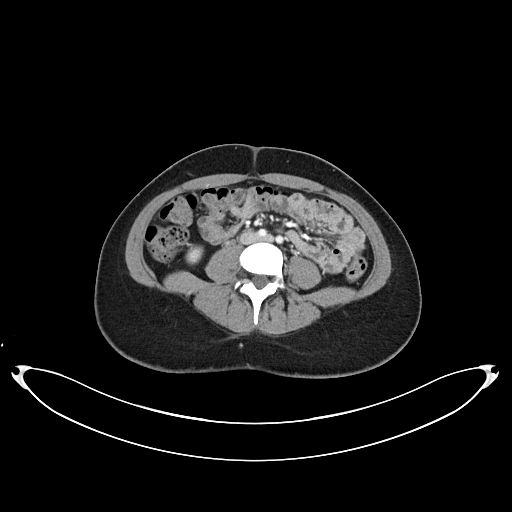
[im 52/88  soft-tissue]
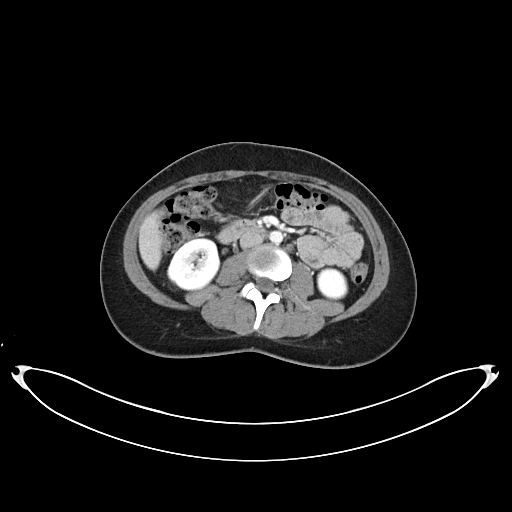
[im 57/88  soft-tissue]
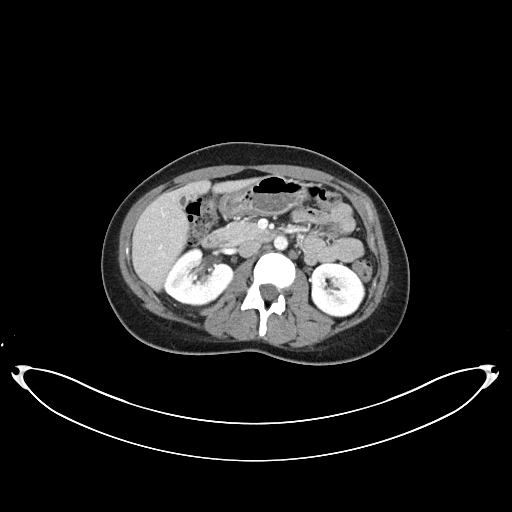
[im 57/88  bone]
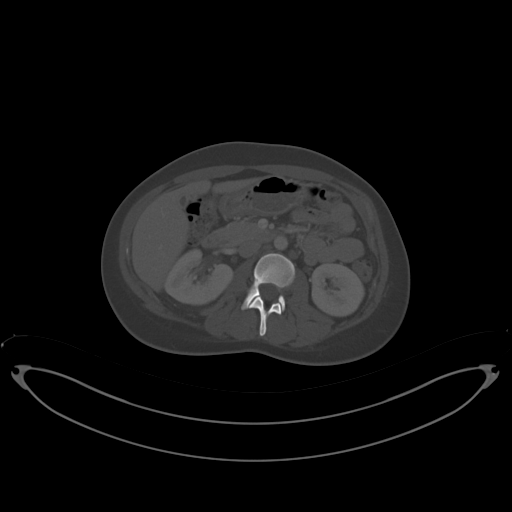
[im 62/88  soft-tissue]
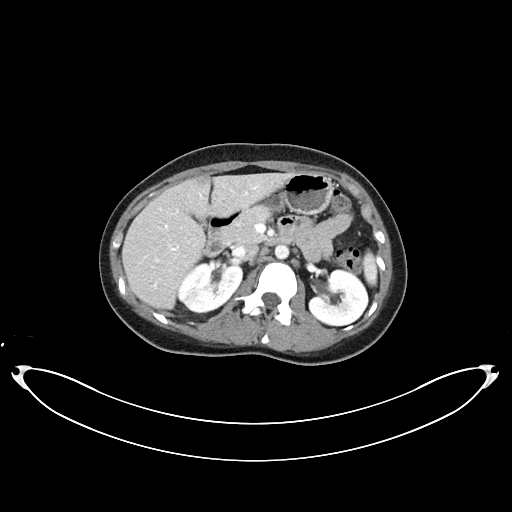
[im 67/88  soft-tissue]
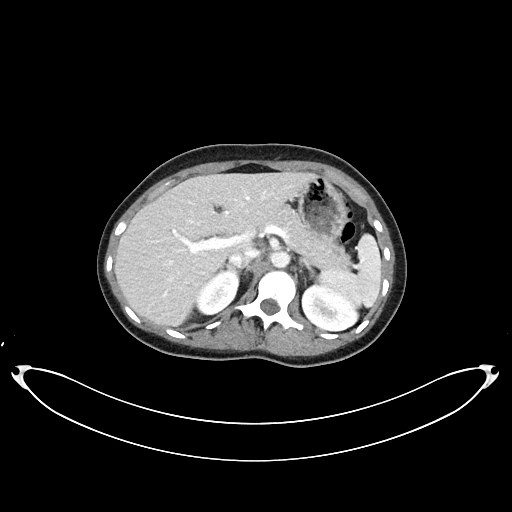
[im 77/88  soft-tissue]
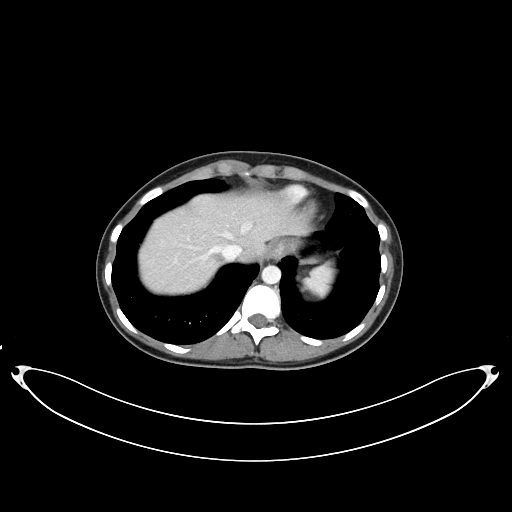
[im 82/88  soft-tissue]
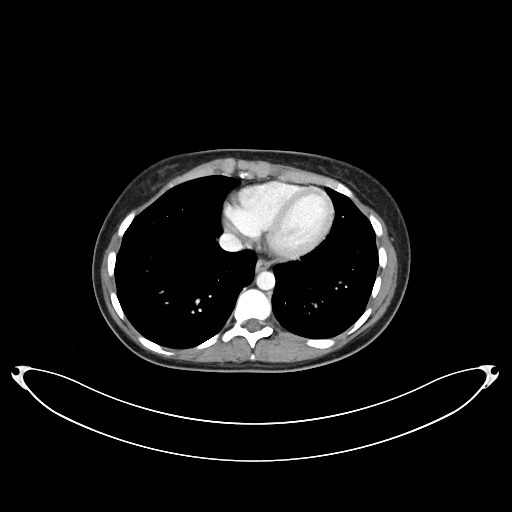

[Series 6: abdomen 3.0 mpr cor · coronal · 0.80mm/px · 3 of 84 slices shown]
[im 28/84  soft-tissue]
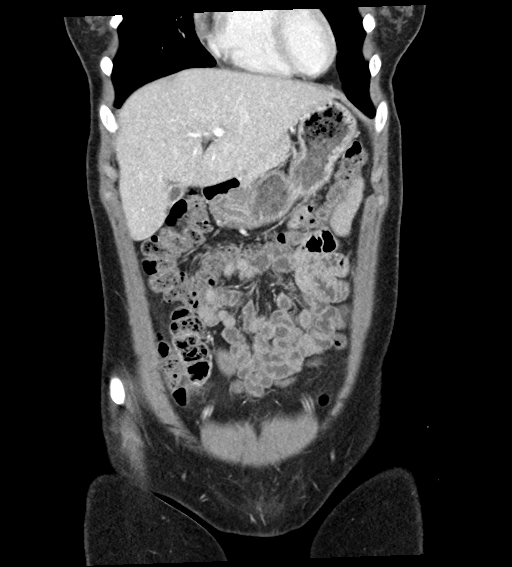
[im 37/84  soft-tissue]
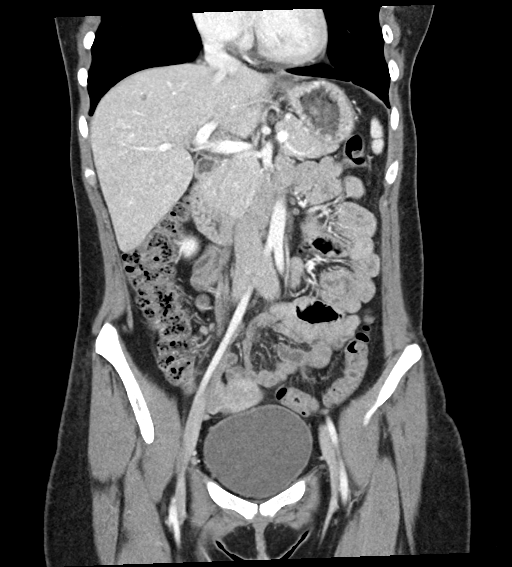
[im 47/84  soft-tissue]
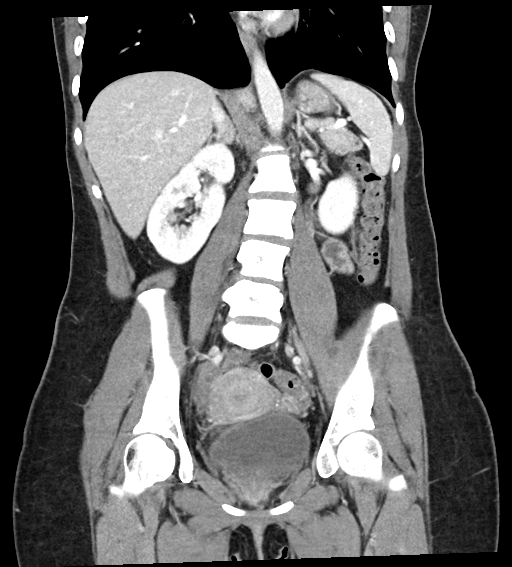

[16 of 46 positions shown; findings below may reference images not displayed]

FINDINGS: Lower chest: The lung bases are clear.

Hepatobiliary: No focal liver abnormality is seen. No gallstones,
gallbladder wall thickening, or biliary dilatation.

Pancreas: Unremarkable. No pancreatic ductal dilatation or
surrounding inflammatory changes.

Spleen: Normal in size without focal abnormality.

Adrenals/Urinary Tract: No adrenal gland nodules. Persistence of
heterogeneous nephrogram in the right kidney consistent with
pyelonephritis. Left kidney is unremarkable. No hydronephrosis or
hydroureter. Bladder is normal. No bladder wall thickening or
filling defect.

Stomach/Bowel: Stomach is within normal limits. Appendix appears
normal. No evidence of bowel wall thickening, distention, or
inflammatory changes.

Vascular/Lymphatic: No significant vascular findings are present. No
enlarged abdominal or pelvic lymph nodes.

Reproductive: Uterus and bilateral adnexa are unremarkable. Small
amount of free fluid in the pelvis is likely physiologic.

Other: No free air in the abdomen. Abdominal wall musculature
appears intact.

Musculoskeletal: No acute or significant osseous findings. Mild
thoracolumbar scoliosis convex towards the left.
IMPRESSION: 1. Persistence of heterogeneous nephrogram in the right kidney
consistent with pyelonephritis.
2. Previous changes of cystitis have resolved in the interval.
3. No evidence of bowel obstruction or inflammation. Appendix is
normal.
4. Small amount of free fluid in the pelvis is likely physiologic.

## 2021-11-28 ENCOUNTER — Encounter (HOSPITAL_BASED_OUTPATIENT_CLINIC_OR_DEPARTMENT_OTHER): Payer: Self-pay | Admitting: Emergency Medicine

## 2021-11-28 ENCOUNTER — Other Ambulatory Visit: Payer: Self-pay

## 2021-11-28 ENCOUNTER — Emergency Department (HOSPITAL_BASED_OUTPATIENT_CLINIC_OR_DEPARTMENT_OTHER): Payer: 59 | Admitting: Radiology

## 2021-11-28 ENCOUNTER — Emergency Department (HOSPITAL_BASED_OUTPATIENT_CLINIC_OR_DEPARTMENT_OTHER)
Admission: EM | Admit: 2021-11-28 | Discharge: 2021-11-28 | Disposition: A | Discharge status: Home / Self Care | Payer: 59 | Attending: Emergency Medicine | Admitting: Emergency Medicine

## 2021-11-28 DIAGNOSIS — X509XXA Other and unspecified overexertion or strenuous movements or postures, initial encounter: Secondary | ICD-10-CM | POA: Insufficient documentation

## 2021-11-28 DIAGNOSIS — R6884 Jaw pain: Secondary | ICD-10-CM | POA: Diagnosis present

## 2021-11-28 DIAGNOSIS — S0302XA Dislocation of jaw, left side, initial encounter: Secondary | ICD-10-CM | POA: Insufficient documentation

## 2021-11-28 DIAGNOSIS — S0300XA Dislocation of jaw, unspecified side, initial encounter: Secondary | ICD-10-CM

## 2021-11-28 NOTE — ED Notes (Signed)
Patient verbalizes understanding of discharge instructions. Opportunity for questioning and answers were provided. Patient discharged from ED.  °

## 2021-11-28 NOTE — ED Triage Notes (Signed)
Was eating  at 6 am and  bit down on something wrong and her jaw popped ou she states  pt handling secretions well, hurts to talk, no drooling

## 2021-11-28 NOTE — ED Provider Notes (Signed)
[facility] EMERGENCY DEPT Provider Note   CSN: [id number] Arrival date & time: 11/28/21  0703     History  Chief Complaint  Patient presents with   Jaw Pain    Chelsea Hammond is a 26 y.o. female.  26 year old female presents with left-sided jaw pain which started after she was chewing a candy bar.  History of prior left TMJ issues.  Has had trouble closing her mouth.  Pain characterizes sharp and worse with movement.  No treatment use prior to arrival      Home Medications Prior to Admission medications   Medication Sig Start Date End Date Taking? Authorizing Provider  amphetamine-dextroamphetamine (ADDERALL XR) 15 MG 24 hr capsule Take 15 mg by mouth every morning.    [provider]  ARIPiprazole (ABILIFY) 5 MG tablet Take 5 mg by mouth at bedtime.    [provider]  lamoTRIgine (LAMICTAL) 100 MG tablet Take 100 mg by mouth daily. 11/19/20   [provider]  levonorgestrel (MIRENA) 20 MCG/DAY IUD 1 each by Intrauterine route once.    [provider]  sertraline (ZOLOFT) 50 MG tablet Take 50 mg by mouth daily. 11/19/20   [provider]  SUMAtriptan (IMITREX) 50 MG tablet Take 1 tablet (50 mg total) by mouth every 2 (two) hours as needed for migraine. May repeat in 2 hours if headache persists or recurs. Patient not taking: Reported on 10/11/2021 10/02/20   Dorie Rank, MD  traZODone (DESYREL) 50 MG tablet  11/19/20   [provider]      Allergies    Codeine    Review of Systems   Review of Systems  All other systems reviewed and are negative.  Physical Exam Updated Vital Signs Ht 1.651 m (5\' 5" )    Wt 56.7 kg    BMI 20.80 kg/m  Physical Exam Vitals and nursing note reviewed.  Constitutional:      General: She is not in acute distress.    Appearance: Normal appearance. She is well-developed. She is not toxic-appearing.  HENT:     Head: Normocephalic and atraumatic.     Mouth/Throat:      Comments: Left pain at tmj Eyes:     General: Lids are normal.     Conjunctiva/sclera: Conjunctivae normal.     Pupils: Pupils are equal, round, and reactive to light.  Neck:     Thyroid: No thyroid mass.     Trachea: No tracheal deviation.  Cardiovascular:     Rate and Rhythm: Normal rate and regular rhythm.     Heart sounds: Normal heart sounds. No murmur heard.   No gallop.  Pulmonary:     Effort: Pulmonary effort is normal. No respiratory distress.     Breath sounds: Normal breath sounds. No stridor. No decreased breath sounds, wheezing, rhonchi or rales.  Abdominal:     General: There is no distension.     Palpations: Abdomen is soft.     Tenderness: There is no abdominal tenderness. There is no rebound.  Musculoskeletal:        General: No tenderness. Normal range of motion.     Cervical back: Normal range of motion and neck supple.  Skin:    General: Skin is warm and dry.     Findings: No abrasion or rash.  Neurological:     Mental Status: She is alert and oriented to person, place, and time. Mental status is at baseline.     GCS: GCS eye subscore is 4.  GCS verbal subscore is 5. GCS motor subscore is 6.     Cranial Nerves: No cranial nerve deficit.     Sensory: No sensory deficit.     Motor: Motor function is intact.  Psychiatric:        Attention and Perception: Attention normal.        Speech: Speech normal.        Behavior: Behavior normal.    ED Results / Procedures / Treatments   Labs (all labs ordered are listed, but only abnormal results are displayed) Labs Reviewed - No data to display  EKG None  Radiology No results found.  Procedures Reduction of dislocation  Date/Time: 11/28/2021 8:10 AM Performed by: Lacretia Leigh, MD Authorized by: Lacretia Leigh, MD  Consent: Verbal consent obtained. Written consent not obtained. Risks and benefits: risks, benefits and alternatives were discussed Time out: Immediately prior to procedure a "time out" was  called to verify the correct patient, procedure, equipment, support staff and site/side marked as required. Local anesthesia used: no  Anesthesia: Local anesthesia used: no  Sedation: Patient sedated: no  Patient tolerance: patient tolerated the procedure well with no immediate complications Comments: Patient is mandible clinically appear to be dislocated.  Traction countertraction used to relocate jaw and patient unable to speak appropriately      Medications Ordered in ED Medications - No data to display  ED Course/ Medical Decision Making/ A&P                           Medical Decision Making  The patient's x-ray performed after drawer relocation shows good alignment.  Patient likely still has TMJ pathology.  She is able to talk at this time.  Airway is protected.  Will give referral to ENT doctor on call.        Final Clinical Impression(s) / ED Diagnoses Final diagnoses:  None    Rx / DC Orders ED Discharge Orders     None         Lacretia Leigh, MD 11/28/21 [phone]

## 2022-01-11 ENCOUNTER — Other Ambulatory Visit: Payer: Self-pay

## 2022-01-11 ENCOUNTER — Emergency Department (HOSPITAL_BASED_OUTPATIENT_CLINIC_OR_DEPARTMENT_OTHER)
Admission: EM | Admit: 2022-01-11 | Discharge: 2022-01-11 | Disposition: A | Discharge status: Home / Self Care | Payer: Commercial Managed Care - HMO | Attending: Emergency Medicine | Admitting: Emergency Medicine

## 2022-01-11 ENCOUNTER — Emergency Department (HOSPITAL_BASED_OUTPATIENT_CLINIC_OR_DEPARTMENT_OTHER): Payer: Commercial Managed Care - HMO

## 2022-01-11 DIAGNOSIS — N831 Corpus luteum cyst of ovary, unspecified side: Secondary | ICD-10-CM | POA: Insufficient documentation

## 2022-01-11 DIAGNOSIS — T8332XA Displacement of intrauterine contraceptive device, initial encounter: Secondary | ICD-10-CM | POA: Insufficient documentation

## 2022-01-11 DIAGNOSIS — R102 Pelvic and perineal pain: Secondary | ICD-10-CM

## 2022-01-11 LAB — COMPREHENSIVE METABOLIC PANEL
ALT: 11 U/L (ref 0–44)
AST: 14 U/L — ABNORMAL LOW (ref 15–41)
Albumin: 5 g/dL (ref 3.5–5.0)
Alkaline Phosphatase: 51 U/L (ref 38–126)
Anion gap: 9 (ref 5–15)
BUN: 10 mg/dL (ref 6–20)
CO2: 27 mmol/L (ref 22–32)
Calcium: 9.5 mg/dL (ref 8.9–10.3)
Chloride: 104 mmol/L (ref 98–111)
Creatinine, Ser: 0.6 mg/dL (ref 0.44–1.00)
GFR, Estimated: >60 mL/min (ref >60)
Glucose, Bld: 81 mg/dL (ref 70–99)
Potassium: 3.4 mmol/L — ABNORMAL LOW (ref 3.5–5.1)
Sodium: 140 mmol/L (ref 135–145)
Total Bilirubin: 0.4 mg/dL (ref 0.3–1.2)
Total Protein: 7.9 g/dL (ref 6.5–8.1)

## 2022-01-11 LAB — URINALYSIS, ROUTINE W REFLEX MICROSCOPIC
Bilirubin Urine: NEGATIVE
Glucose, UA: NEGATIVE mg/dL
Hgb urine dipstick: NEGATIVE
Ketones, ur: NEGATIVE mg/dL
Leukocytes,Ua: NEGATIVE
Nitrite: NEGATIVE
Protein, ur: NEGATIVE mg/dL
Specific Gravity, Urine: 1.005 (ref 1.005–1.030)
pH: 7 (ref 5.0–8.0)

## 2022-01-11 LAB — PREGNANCY, URINE: Preg Test, Ur: NEGATIVE

## 2022-01-11 LAB — CBC WITH DIFFERENTIAL/PLATELET
Abs Immature Granulocytes: 0.02 10*3/uL (ref 0.00–0.07)
Basophils Absolute: 0 10*3/uL (ref 0.0–0.1)
Basophils Relative: 1 %
Eosinophils Absolute: 0.2 10*3/uL (ref 0.0–0.5)
Eosinophils Relative: 4 %
HCT: 39.8 % (ref 36.0–46.0)
Hemoglobin: 13 g/dL (ref 12.0–15.0)
Immature Granulocytes: 0 %
Lymphocytes Relative: 35 %
Lymphs Abs: 2 10*3/uL (ref 0.7–4.0)
MCH: 31.1 pg (ref 26.0–34.0)
MCHC: 32.7 g/dL (ref 30.0–36.0)
MCV: 95.2 fL (ref 80.0–100.0)
Monocytes Absolute: 0.5 10*3/uL (ref 0.1–1.0)
Monocytes Relative: 9 %
Neutro Abs: 2.9 10*3/uL (ref 1.7–7.7)
Neutrophils Relative %: 51 %
Platelets: 206 10*3/uL (ref 150–400)
RBC: 4.18 MIL/uL (ref 3.87–5.11)
RDW: 13.4 % (ref 11.5–15.5)
WBC: 5.7 10*3/uL (ref 4.0–10.5)
nRBC: 0 % (ref 0.0–0.2)

## 2022-01-11 LAB — LIPASE, BLOOD: Lipase: 38 U/L (ref 11–51)

## 2022-01-11 MED ORDER — FENTANYL CITRATE PF 50 MCG/ML IJ SOSY
50.0000 ug | PREFILLED_SYRINGE | Freq: Once | INTRAMUSCULAR | Status: AC
  Administered 2022-01-11: 50 ug via INTRAVENOUS
  Filled 2022-01-11: qty 1

## 2022-01-11 MED ORDER — SODIUM CHLORIDE 0.9 % IV BOLUS
1000.0000 mL | Freq: Once | INTRAVENOUS | Status: AC
  Administered 2022-01-11: 1000 mL via INTRAVENOUS

## 2022-01-11 MED ORDER — ONDANSETRON HCL 4 MG/2ML IJ SOLN
4.0000 mg | Freq: Once | INTRAMUSCULAR | Status: AC
  Administered 2022-01-11: 4 mg via INTRAVENOUS
  Filled 2022-01-11: qty 2

## 2022-01-11 MED ORDER — IOHEXOL 300 MG/ML  SOLN
75.0000 mL | Freq: Once | INTRAMUSCULAR | Status: AC | PRN
  Administered 2022-01-11: 75 mL via INTRAVENOUS

## 2022-01-11 NOTE — ED Provider Notes (Signed)
? ?Emergency Department Provider Note ? ? ?I have reviewed the triage vital signs and the nursing notes. ? ? ?HISTORY ? ?Chief Complaint ?Abdominal Pain ? ? ?HPI ?Chelsea Hammond is a 26 y.o. female past medical history reviewed below presents to the emergency department with acute onset suprapubic abdominal pain.  She is not having vaginal bleeding or discharge.  Symptoms began abruptly today. Denies constipation. Had a BM today which was normal and non-painful. No vomiting. No radiation of pain to the upper abdomen or chest.  ? ? ?Past Medical History:  ?Diagnosis Date  ? Anxiety   ? Bacteremia due to Escherichia coli 07/08/2020  ? Bipolar disorder (HCC) 07/05/2020  ? Depression   ? Pyelonephritis 07/05/2020  ? Scoliosis   ? ? ?Review of Systems ? ?Constitutional: No fever/chills ?Eyes: No visual changes. ?ENT: No sore throat. ?Cardiovascular: Denies chest pain. ?Respiratory: Denies shortness of breath. ?Gastrointestinal: No abdominal pain.  No nausea, no vomiting.  No diarrhea.  No constipation. ?Genitourinary: Negative for dysuria. ?Musculoskeletal: Negative for back pain. ?Skin: Negative for rash. ?Neurological: Negative for headaches, focal weakness or numbness. ? ? ?____________________________________________ ? ? ?PHYSICAL EXAM: ? ?VITAL SIGNS: ?ED Triage Vitals  ?Enc Vitals Group  ?   BP 01/11/22 1947 126/79  ?   Pulse Rate 01/11/22 1947 (!) 126  ?   Resp 01/11/22 1947 20  ?   Temp 01/11/22 1947 98.7 ?F (37.1 ?C)  ?   Temp Source 01/11/22 1947 Oral  ?   SpO2 01/11/22 1947 99 %  ?   Weight 01/11/22 1948 125 lb (56.7 kg)  ?   Height 01/11/22 1948 5\' 5"  (1.651 m)  ? ?Constitutional: Alert and oriented. Well appearing and in no acute distress. Intermittently wincing in pain.  ?Eyes: Conjunctivae are normal.  ?Head: Atraumatic. ?Nose: No congestion/rhinnorhea. ?Mouth/Throat: Mucous membranes are moist.  ?Neck: No stridor.   ?Cardiovascular: Normal rate, regular rhythm. Good peripheral circulation. Grossly  normal heart sounds.   ?Respiratory: Normal respiratory effort.  No retractions. Lungs CTAB. ?Gastrointestinal: Soft with mild suprapubic tenderness. No rebound or guarding. No tenderness in the upper abdomen. No distention.  ?Musculoskeletal: No gross deformities of extremities. ?Neurologic:  Normal speech and language.  ?Skin:  Skin is warm, dry and intact. No rash noted. ? ? ?____________________________________________ ?  ?LABS ?(all labs ordered are listed, but only abnormal results are displayed) ? ?Labs Reviewed  ?COMPREHENSIVE METABOLIC PANEL - Abnormal; Notable for the following components:  ?    Result Value  ? Potassium 3.4 (*)   ? AST 14 (*)   ? All other components within normal limits  ?URINALYSIS, ROUTINE W REFLEX MICROSCOPIC - Abnormal; Notable for the following components:  ? Color, Urine COLORLESS (*)   ? All other components within normal limits  ?LIPASE, BLOOD  ?CBC WITH DIFFERENTIAL/PLATELET  ?PREGNANCY, URINE  ? ? ?____________________________________________ ? ? ?PROCEDURES ? ?Procedure(s) performed:  ? ?Procedures ? ?None  ?____________________________________________ ? ? ?INITIAL IMPRESSION / ASSESSMENT AND PLAN / ED COURSE ? ?Pertinent labs & imaging results that were available during my care of the patient were reviewed by me and considered in my medical decision making (see chart for details). ?  ?This patient is Presenting for Evaluation of abdominal pain, which does require a range of treatment options, and is a complaint that involves a high risk of morbidity and mortality. ? ?The Differential Diagnoses includes but is not exclusive to ectopic pregnancy, ovarian cyst, ovarian torsion, acute appendicitis, urinary tract infection, endometriosis, bowel  obstruction, hernia, colitis, renal colic, gastroenteritis, volvulus etc. ? ? ?Critical Interventions- IV Pain medication, zofran, and IVF ?  ?Medications  ?sodium chloride 0.9 % bolus 1,000 mL (0 mLs Intravenous Stopped 01/11/22 2123)   ?fentaNYL (SUBLIMAZE) injection 50 mcg (50 mcg Intravenous Given 01/11/22 2027)  ?ondansetron [facility]) injection 4 mg (4 mg Intravenous Given 01/11/22 2026)  ?iohexol (OMNIPAQUE) 300 MG/ML solution 75 mL (75 mLs Intravenous Contrast Given 01/11/22 2307)  ? ? ?Reassessment after intervention:  pain improved.  ? ? ?I did obtain Additional Historical Information from friend at bedside. ? ?I decided to review pertinent External Data, and in summary last ED visit was 11/28/21 for jaw dislocation. ?  ?Clinical Laboratory Tests Ordered, included negative pregnancy test. No AKI. No anemia or leukocytosis. UA is unremarkable.  ? ?Radiologic Tests Ordered, included CT abdomen/pelvis and [place]. I independently interpreted the images and agree with radiology interpretation.  ? ?Cardiac Monitor Tracing which shows sinus tachycardia. ? ? ?Social Determinants of Health Risk denies smoking.  ? ?Medical Decision Making: Summary:  ?Patient presents to the emergency department for evaluation of lower abdominal pain which began suddenly.  Mainly suprapubic symptoms.  Plan for labs and UA to evaluate for urinary tract infection.  She does have tachycardia but no fever or hypotension.  Low suspicion for developing sepsis.  Hold on abdominal imaging for now but will reevaluate after initial lab work is back.  Pain and nausea medication given. ? ?Reevaluation with update and discussion with patient. Symptoms improved. [place] reviewed and sent for CT but likely a ruptured ovarian cyst. Patient looking well, feeling improved, and remains HDS. Plan for outpatient PCP and Gyn follow up. Did discuss the malposition of the IUD and that it may not be functioning properly and could cause injury if left un-treated. She plans to f/u with [facility] who placed the IUD.  ? ?Disposition: discharge ? ?____________________________________________ ? ?FINAL CLINICAL IMPRESSION(S) / ED DIAGNOSES ? ?Final diagnoses:  ?Pelvic pain  ?Corpus luteum cyst rupture   ?Malpositioned intrauterine device (IUD), initial encounter  ? ? ? ?Note:  This document was prepared using Dragon voice recognition software and may include unintentional dictation errors. ? ?Alona Bene, MD, FACEP ?Emergency Medicine ? ?  ?Maia Plan, MD ?01/20/22 [phone] ? ?

## 2022-01-11 NOTE — Discharge Instructions (Addendum)
You were seen in the emergency room today with lower abdominal pain.  Your CT scan shows fluid in the pelvis likely from a ruptured cyst.  I suspect this caused your pain earlier.  We did find that your IUD is not in the proper location and may not be functioning properly.  It may also cause complications if it stays malpositioned.  Please follow with [facility] for evaluation of your IUD and potentially removal/repositioning.  ?

## 2022-01-11 NOTE — ED Triage Notes (Signed)
Lower abd pain started this morning out of nowhere. States sharp pains come and go. Denies any V/N/D, pt had a BM today that was normal for her.  ?

## 2022-02-07 ENCOUNTER — Encounter (HOSPITAL_COMMUNITY): Payer: Self-pay | Admitting: Radiology

## 2022-04-11 ENCOUNTER — Encounter: Payer: Self-pay | Admitting: Family Medicine

## 2022-04-11 ENCOUNTER — Ambulatory Visit (INDEPENDENT_AMBULATORY_CARE_PROVIDER_SITE_OTHER): Payer: Commercial Managed Care - HMO | Admitting: Family Medicine

## 2022-04-11 VITALS — BP 110/60 | HR 97 | Temp 98.7°F | Ht 65.0 in | Wt 131.2 lb

## 2022-04-11 DIAGNOSIS — Z0000 Encounter for general adult medical examination without abnormal findings: Secondary | ICD-10-CM | POA: Diagnosis not present

## 2022-04-11 DIAGNOSIS — L989 Disorder of the skin and subcutaneous tissue, unspecified: Secondary | ICD-10-CM | POA: Diagnosis not present

## 2022-04-11 DIAGNOSIS — F900 Attention-deficit hyperactivity disorder, predominantly inattentive type: Secondary | ICD-10-CM

## 2022-04-11 DIAGNOSIS — M249 Joint derangement, unspecified: Secondary | ICD-10-CM

## 2022-04-11 DIAGNOSIS — F319 Bipolar disorder, unspecified: Secondary | ICD-10-CM | POA: Diagnosis not present

## 2022-04-11 LAB — CBC WITH DIFFERENTIAL/PLATELET
Basophils Absolute: 0 10*3/uL (ref 0.0–0.1)
Basophils Relative: 0.5 % (ref 0.0–3.0)
Eosinophils Absolute: 0.1 10*3/uL (ref 0.0–0.7)
Eosinophils Relative: 3 % (ref 0.0–5.0)
HCT: 34.8 % — ABNORMAL LOW (ref 36.0–46.0)
Hemoglobin: 11.6 g/dL — ABNORMAL LOW (ref 12.0–15.0)
Lymphocytes Relative: 36.5 % (ref 12.0–46.0)
Lymphs Abs: 1.6 10*3/uL (ref 0.7–4.0)
MCHC: 33.4 g/dL (ref 30.0–36.0)
MCV: 95.5 fl (ref 78.0–100.0)
Monocytes Absolute: 0.4 10*3/uL (ref 0.1–1.0)
Monocytes Relative: 8.9 % (ref 3.0–12.0)
Neutro Abs: 2.2 10*3/uL (ref 1.4–7.7)
Neutrophils Relative %: 51.1 % (ref 43.0–77.0)
Platelets: 207 10*3/uL (ref 150.0–400.0)
RBC: 3.65 Mil/uL — ABNORMAL LOW (ref 3.87–5.11)
RDW: 13.5 % (ref 11.5–15.5)
WBC: 4.3 10*3/uL (ref 4.0–10.5)

## 2022-04-11 LAB — COMPREHENSIVE METABOLIC PANEL
ALT: 9 U/L (ref 0–35)
AST: 14 U/L (ref 0–37)
Albumin: 4.4 g/dL (ref 3.5–5.2)
Alkaline Phosphatase: 44 U/L (ref 39–117)
BUN: 9 mg/dL (ref 6–23)
CO2: 26 mEq/L (ref 19–32)
Calcium: 8.9 mg/dL (ref 8.4–10.5)
Chloride: 105 mEq/L (ref 96–112)
Creatinine, Ser: 0.65 mg/dL (ref 0.40–1.20)
GFR: 123.54 mL/min (ref >60.00)
Glucose, Bld: 85 mg/dL (ref 70–99)
Potassium: 3.7 mEq/L (ref 3.5–5.1)
Sodium: 137 mEq/L (ref 135–145)
Total Bilirubin: 0.3 mg/dL (ref 0.2–1.2)
Total Protein: 7 g/dL (ref 6.0–8.3)

## 2022-04-11 LAB — LIPID PANEL
Cholesterol: 197 mg/dL (ref 0–200)
HDL: 66.3 mg/dL (ref >39.00)
LDL Cholesterol: 120 mg/dL — ABNORMAL HIGH (ref 0–99)
NonHDL: 131.17
Total CHOL/HDL Ratio: 3
Triglycerides: 55 mg/dL (ref 0.0–149.0)
VLDL: 11 mg/dL (ref 0.0–40.0)

## 2022-04-11 NOTE — Progress Notes (Signed)
Subjective  Chief Complaint  Patient presents with   Annual Exam    Pt here for Annual exam and is currently fasting. Pt is wanting to get checked for ehlers-donel syndrome     HPI: Chelsea Hammond is a 26 y.o. adult who presents to [facility] today for a Female Wellness Visit.  He also has the concerns and/or needs as listed above in the chief complaint. These will be addressed in addition to the Health Maintenance Visit.   Wellness Visit: annual visit with health maintenance review and exam without Pap  Health maintenance: Patient sees GYN.  Recently had IUD removed due to malpositioning.  Does not require or any birth control.  Patient is on testosterone replacement.  Patient is a sexual.  Identifies as female.  Cervical cancer screening is up-to-date.  Need immunization records.  Patient reports up-to-date. Chronic disease management visit and/or acute problem visit: Requesting testing for Ehlers-Danlos syndrome.  Reports hypermobility of multiple joints, high skin elasticity, easy bruising and easy bleeding.  No documented history in patient's family however sister and mother reports similar symptoms.  Occasionally will have joint pain. See psychiatry to manage mood disorder.  Reports good control.  ADD is controlled.  Assessment  1. Annual physical exam   2. Hypermobility of joint   3. Skin abnormality   4. Bipolar affective disorder, remission status unspecified (HCC) Chronic  5. Attention deficit hyperactivity disorder (ADHD), predominantly inattentive type      Plan  Female Wellness Visit: Age appropriate Health Maintenance and Prevention measures were discussed with patient. Included topics are cancer screening recommendations, ways to keep healthy (see AVS) including dietary and exercise recommendations, regular eye and dental care, use of seat belts, and avoidance of moderate alcohol use and tobacco use.  BMI: discussed patient's BMI and  encouraged positive lifestyle modifications to help get to or maintain a target BMI. HM needs and immunizations were addressed and ordered. See below for orders. See HM and immunization section for updates.  Patient will get me immunization history Routine labs and screening tests ordered including cmp, cbc and lipids where appropriate. Discussed recommendations regarding Vit D and calcium supplementation (see AVS)  Chronic disease f/u and/or acute problem visit: (deemed necessary to be done in addition to the wellness visit): Hypermobility of joints and high skin elasticity: Possibly genetic related due to mother and sister with similar symptoms.  Refer to genetics for further screening consultation. Follow-up with psychiatry for mood disorder and ADD.  Follow up: 12 months for complete physical  Orders Placed This Encounter  Procedures   Hepatitis C Antibody   Comp Met (CMET)   Lipid Profile   CBC with Differential/Platelet   Ambulatory referral to Genetics   No orders of the defined types were placed in this encounter.      Body mass index is 21.83 kg/m. Wt Readings from Last 3 Encounters:  04/11/22 131 lb 3.2 oz (59.5 kg)  01/11/22 125 lb (56.7 kg)  11/28/21 125 lb (56.7 kg)   Need for contraception: No,   Patient Active Problem List   Diagnosis Date Noted   Scoliosis 10/11/2021   History of pyelonephritis - sepsis  10/11/2021    2021     Attention deficit hyperactivity disorder (ADHD), predominantly inattentive type 10/11/2021   Constipation 07/08/2020   Bipolar disorder ([place]) 07/05/2020   Health Maintenance  Topic Date Due   Hepatitis C Screening  Never done   HPV VACCINES (2 - 3-dose  series) 06/30/2016   INFLUENZA VACCINE  06/13/2022   TETANUS/TDAP  09/02/2023   PAP SMEAR-Modifier  07/28/2024   PAP-Cervical Cytology Screening  09/27/2024   HIV Screening  Completed   Immunization History  Administered Date(s) Administered   DTaP 08/20/2000, 10/01/2000,  11/16/2000, 05/21/2002   HPV 9-valent 06/02/2016   Hepatitis B 03-Jul-1998, 04/19/1998, 08/20/2000   HiB (PRP-OMP) 08/20/2000   IPV January 20, 1998, 08/20/2000, 10/01/2000, 05/21/2002   Influenza,inj,Quad PF,6+ Mos 10/11/2021   MMR 08/20/2000, 05/21/2002   Meningococcal Conjugate 09/01/2013, 06/02/2016   Tdap 09/01/2013   Varicella 05/23/2002, 04/23/2007   We updated and reviewed the patient's past history in detail and it is documented below. Allergies: Patient  reports no history of alcohol use. Past Medical History Patient  has a past medical history of Anxiety, Bacteremia due to Escherichia coli (07/08/2020), Bipolar disorder ([place]) (07/05/2020), Depression, Pyelonephritis (07/05/2020), and Scoliosis. Past Surgical History Patient  has a past surgical history that includes Tonsillectomy and Adenoidectomy. Social History   Socioeconomic History   Marital status: Single    Spouse name: Not on file   Number of children: Not on file   Years of education: Not on file   Highest education level: Not on file  Occupational History   Not on file  Tobacco Use   Smoking status: Never   Smokeless tobacco: Never  Vaping Use   Vaping Use: Never used  Substance and Sexual Activity   Alcohol use: No    Alcohol/week: 0.0 standard drinks   Drug use: No   Sexual activity: Yes    Birth control/protection: I.U.D.    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   Lives with friends, [place]   Long distant relationshipa   Social Determinants of Health   Financial Resource Strain: Not on file  Food Insecurity: Not on file  Transportation Needs: Not on file  Physical Activity: Not on file  Stress: Not on file  Social Connections: Not on file   Family History  Problem Relation Age of Onset   Healthy Mother    Hypertension Father    Diabetes Father    GER disease  Father    Parkinson's disease Paternal Grandfather    Hypertension Other     Review of Systems: Constitutional: negative for fever or malaise Ophthalmic: negative for photophobia, double vision or loss of vision Cardiovascular: negative for chest pain, dyspnea on exertion, or new LE swelling Respiratory: negative for SOB or persistent cough Gastrointestinal: negative for abdominal pain, change in bowel habits or melena Genitourinary: negative for dysuria or gross hematuria, no abnormal uterine bleeding or disharge Musculoskeletal: negative for new gait disturbance or muscular weakness Integumentary: negative for new or persistent rashes, no breast lumps Neurological: negative for TIA or stroke symptoms Psychiatric: negative for SI or delusions Allergic/Immunologic: negative for hives  Patient Care Team    Relationship Specialty Notifications Start End  Leamon Arnt, MD PCP - General Family Medicine  10/11/21     Objective  Vitals: BP 110/60   Pulse 97   Temp 98.7 F (37.1 C)   Ht $R'5\' 5"'aM$  (1.651 m)   Wt 131 lb 3.2 oz (59.5 kg)   SpO2 97%   BMI 21.83 kg/m  General:  Well developed, well nourished, no acute distress  Psych:  Alert and orientedx3,normal mood and affect HEENT:  Normocephalic, atraumatic, non-icteric sclera, PERRL, supple  neck without adenopathy, mass or thyromegaly Cardiovascular:  Normal S1, S2, RRR without gallop, rub or murmur Respiratory:  Good breath sounds bilaterally, CTAB with normal respiratory effort Gastrointestinal: normal bowel sounds, soft, non-tender, no noted masses. No HSM MSK: no deformities, contusions. Joints are without erythema or swelling.  Can easily touch forearm with thumb.  Elbows are hypermobile  Skin:  Warm, no rashes or suspicious lesions noted Neurologic:    Mental status is normal. Gross motor and sensory exams are normal. Normal gait. No tremor    Commons side effects, risks, benefits, and alternatives for medications and  treatment plan prescribed today were discussed, and the patient expressed understanding of the given instructions. Patient is instructed to call or message via MyChart if he/she has any questions or concerns regarding our treatment plan. No barriers to understanding were identified. We discussed Red Flag symptoms and signs in detail. Patient expressed understanding regarding what to do in case of urgent or emergency type symptoms.  Medication list was reconciled, printed and provided to the patient in AVS. Patient instructions and summary information was reviewed with the patient as documented in the AVS. This note was prepared with assistance of Dragon voice recognition software. Occasional wrong-word or sound-a-like substitutions may have occurred due to the inherent limitations of voice recognition software  This visit occurred during the SARS-CoV-2 public health emergency.  Safety protocols were in place, including screening questions prior to the visit, additional usage of staff PPE, and extensive cleaning of exam room while observing appropriate contact time as indicated for disinfecting solutions.

## 2022-04-11 NOTE — Patient Instructions (Signed)
Please return in 12 months for your annual complete physical; please come fasting.   Please send me your immunization records.   We will call you with information regarding your referral appointment. I've referred you to genetics to discuss possible EDS testing.  If you do not hear from [place] within the next 2 weeks, please let me know. It can take 1-2 weeks to get appointments set up with the specialists.    If you have any questions or concerns, please don't hesitate to send me a message via MyChart or call the office at [phone]. Thank you for visiting with [place] today! It's our pleasure caring for you.

## 2022-04-12 LAB — HEPATITIS C ANTIBODY
Hepatitis C Ab: NONREACTIVE
SIGNAL TO CUT-OFF: 0.07 (ref <1.00)

## 2022-06-23 ENCOUNTER — Encounter (HOSPITAL_COMMUNITY): Payer: Self-pay

## 2022-06-23 ENCOUNTER — Other Ambulatory Visit: Payer: Self-pay

## 2022-06-23 ENCOUNTER — Encounter (HOSPITAL_BASED_OUTPATIENT_CLINIC_OR_DEPARTMENT_OTHER): Payer: Self-pay | Admitting: Emergency Medicine

## 2022-06-23 ENCOUNTER — Emergency Department (HOSPITAL_COMMUNITY): Payer: Commercial Managed Care - HMO

## 2022-06-23 ENCOUNTER — Emergency Department (HOSPITAL_BASED_OUTPATIENT_CLINIC_OR_DEPARTMENT_OTHER)
Admission: EM | Admit: 2022-06-23 | Discharge: 2022-06-23 | Disposition: A | Discharge status: Home / Self Care | Payer: Commercial Managed Care - HMO | Attending: Emergency Medicine | Admitting: Emergency Medicine

## 2022-06-23 ENCOUNTER — Emergency Department (HOSPITAL_BASED_OUTPATIENT_CLINIC_OR_DEPARTMENT_OTHER): Payer: Commercial Managed Care - HMO

## 2022-06-23 DIAGNOSIS — R479 Unspecified speech disturbances: Secondary | ICD-10-CM | POA: Insufficient documentation

## 2022-06-23 DIAGNOSIS — R000 Tachycardia, unspecified: Secondary | ICD-10-CM | POA: Insufficient documentation

## 2022-06-23 DIAGNOSIS — Z79899 Other long term (current) drug therapy: Secondary | ICD-10-CM | POA: Insufficient documentation

## 2022-06-23 DIAGNOSIS — R531 Weakness: Secondary | ICD-10-CM | POA: Diagnosis not present

## 2022-06-23 LAB — RAPID URINE DRUG SCREEN, HOSP PERFORMED
Amphetamines: POSITIVE — AB
Barbiturates: NOT DETECTED
Benzodiazepines: NOT DETECTED
Cocaine: NOT DETECTED
Opiates: NOT DETECTED
Tetrahydrocannabinol: NOT DETECTED

## 2022-06-23 LAB — COMPREHENSIVE METABOLIC PANEL
ALT: 14 U/L (ref 0–44)
AST: 19 U/L (ref 15–41)
Albumin: 4.4 g/dL (ref 3.5–5.0)
Alkaline Phosphatase: 55 U/L (ref 38–126)
Anion gap: 8 (ref 5–15)
BUN: 9 mg/dL (ref 6–20)
CO2: 25 mmol/L (ref 22–32)
Calcium: 9.1 mg/dL (ref 8.9–10.3)
Chloride: 104 mmol/L (ref 98–111)
Creatinine, Ser: 0.74 mg/dL (ref 0.44–1.00)
GFR, Estimated: >60 mL/min (ref >60)
Glucose, Bld: 93 mg/dL (ref 70–99)
Potassium: 3.5 mmol/L (ref 3.5–5.1)
Sodium: 137 mmol/L (ref 135–145)
Total Bilirubin: 0.4 mg/dL (ref 0.3–1.2)
Total Protein: 7.9 g/dL (ref 6.5–8.1)

## 2022-06-23 LAB — CBC WITH DIFFERENTIAL/PLATELET
Abs Immature Granulocytes: 0.01 10*3/uL (ref 0.00–0.07)
Basophils Absolute: 0 10*3/uL (ref 0.0–0.1)
Basophils Relative: 1 %
Eosinophils Absolute: 0.1 10*3/uL (ref 0.0–0.5)
Eosinophils Relative: 2 %
HCT: 39.5 % (ref 36.0–46.0)
Hemoglobin: 12.9 g/dL (ref 12.0–15.0)
Immature Granulocytes: 0 %
Lymphocytes Relative: 37 %
Lymphs Abs: 2.3 10*3/uL (ref 0.7–4.0)
MCH: 30.2 pg (ref 26.0–34.0)
MCHC: 32.7 g/dL (ref 30.0–36.0)
MCV: 92.5 fL (ref 80.0–100.0)
Monocytes Absolute: 0.6 10*3/uL (ref 0.1–1.0)
Monocytes Relative: 9 %
Neutro Abs: 3.3 10*3/uL (ref 1.7–7.7)
Neutrophils Relative %: 51 %
Platelets: 235 10*3/uL (ref 150–400)
RBC: 4.27 MIL/uL (ref 3.87–5.11)
RDW: 13.6 % (ref 11.5–15.5)
WBC: 6.3 10*3/uL (ref 4.0–10.5)
nRBC: 0 % (ref 0.0–0.2)

## 2022-06-23 LAB — ACETAMINOPHEN LEVEL: Acetaminophen (Tylenol), Serum: <10 ug/mL — ABNORMAL LOW (ref 10–30)

## 2022-06-23 LAB — CBG MONITORING, ED: Glucose-Capillary: 89 mg/dL (ref 70–99)

## 2022-06-23 LAB — HCG, SERUM, QUALITATIVE: Preg, Serum: NEGATIVE

## 2022-06-23 LAB — ETHANOL: Alcohol, Ethyl (B): <10 mg/dL (ref <10)

## 2022-06-23 LAB — SALICYLATE LEVEL: Salicylate Lvl: <7 mg/dL — ABNORMAL LOW (ref 7.0–30.0)

## 2022-06-23 MED ORDER — SODIUM CHLORIDE 0.9 % IV BOLUS
500.0000 mL | Freq: Once | INTRAVENOUS | Status: AC
  Administered 2022-06-23: 500 mL via INTRAVENOUS

## 2022-06-23 MED ORDER — BENZTROPINE MESYLATE 1 MG/ML IJ SOLN
1.0000 mg | Freq: Once | INTRAMUSCULAR | Status: AC
  Administered 2022-06-23: 1 mg via INTRAMUSCULAR
  Filled 2022-06-23: qty 2

## 2022-06-23 MED ORDER — IOHEXOL 350 MG/ML SOLN
75.0000 mL | Freq: Once | INTRAVENOUS | Status: AC | PRN
  Administered 2022-06-23: 75 mL via INTRAVENOUS

## 2022-06-23 NOTE — Discharge Instructions (Addendum)
Your work-up today did not show any evidence of acute stroke.  Both the CT scans and MRI were completely reassuring.  We feel you are safe for discharge home but please follow-up with your primary doctor and also consider following up with neurology.  If any symptoms change or recur or worsen, please return to the nearest emergency department.

## 2022-06-23 NOTE — ED Provider Notes (Signed)
[facility] EMERGENCY DEPARTMENT Provider Note   CSN: [id number] Arrival date & time: 06/23/22  0549     History  Chief Complaint  Patient presents with   Weakness    Chelsea Hammond is a 26 y.o. adult.  The history is provided by the patient.  Weakness Severity:  Moderate Timing:  Constant Progression:  Unchanged Chronicity:  New Context: change in medication   Context: not urinary tract infection   Context comment:  Lamictal dose increased Relieved by:  Nothing Worsened by:  Nothing Ineffective treatments:  None tried Associated symptoms: no drooling, no fever and no vomiting   Associated symptoms comment:  Unable to speak and L sided weakness.  Patient was able to text parents this am to say that he could not speak and had weakness.   Risk factors: no anemia, no family hx of stroke and no neurologic disease   Patient with Bipolar disorder and ADHD, female to female transitioning presents with L sided weakness and inability to speak and jerking movement of the legs.  Last seen normal was yesterday at an unknown time.  Patient texted his parents to tell them about his symptoms this am.  Patient was recently increased on his lamictal.       Home Medications Prior to Admission medications   Medication Sig Start Date End Date Taking? Authorizing Provider  amphetamine-dextroamphetamine (ADDERALL XR) 15 MG 24 hr capsule Take 15 mg by mouth every morning.    [provider]  lamoTRIgine (LAMICTAL) 100 MG tablet Take 100 mg by mouth daily. 11/19/20   [provider]  sertraline (ZOLOFT) 50 MG tablet Take 50 mg by mouth daily. 11/19/20   [provider]  Testosterone Cypionate 200 MG/ML SOLN Inject as directed.    [provider]  traZODone (DESYREL) 50 MG tablet  11/19/20   [provider]      Allergies    Codeine and Prednisone    Review of Systems   Review of Systems  Constitutional:  Negative for fever.  HENT:   Negative for drooling and facial swelling.   Eyes:  Negative for redness.  Respiratory:  Negative for wheezing and stridor.   Gastrointestinal:  Negative for vomiting.  Neurological:  Positive for weakness. Negative for facial asymmetry.       Jerking movements of the lower extremities   All other systems reviewed and are negative.   Physical Exam Updated Vital Signs BP (!) 138/90 (BP Location: Right Arm)   Pulse (!) 117   Temp 98.1 F (36.7 C) (Oral)   Resp 20   Ht 5\' 5"  (1.651 m)   Wt 59.5 kg   LMP 05/22/2022   SpO2 98%   BMI 21.83 kg/m  Physical Exam Vitals and nursing note reviewed. Exam conducted with a chaperone present.  Constitutional:      General: He is not in acute distress.    Appearance: He is well-developed. He is not diaphoretic.  HENT:     Head: Normocephalic and atraumatic.     Nose: Nose normal.     Mouth/Throat:     Mouth: Mucous membranes are moist.     Pharynx: Oropharynx is clear.     Comments: Darting movements of the tongue  Eyes:     Extraocular Movements: Extraocular movements intact.     Conjunctiva/sclera: Conjunctivae normal.     Pupils: Pupils are equal, round, and reactive to light.  Cardiovascular:     Rate and Rhythm: Regular rhythm. Tachycardia present.  Pulses: Normal pulses.     Heart sounds: Normal heart sounds.  Pulmonary:     Effort: Pulmonary effort is normal.     Breath sounds: Normal breath sounds. No wheezing or rales.  Abdominal:     General: Bowel sounds are normal.     Palpations: Abdomen is soft.     Tenderness: There is no abdominal tenderness. There is no guarding or rebound.  Musculoskeletal:        General: Normal range of motion.     Cervical back: Normal range of motion and neck supple.     Right lower leg: No edema.     Left lower leg: No edema.  Skin:    General: Skin is warm and dry.     Capillary Refill: Capillary refill takes less than 2 seconds.  Neurological:     General: No focal deficit present.      Mental Status: He is alert and oriented to person, place, and time.     Cranial Nerves: No cranial nerve deficit.     Sensory: No sensory deficit.     Deep Tendon Reflexes: Reflexes normal.     Comments: Jerking movements of the legs with movement.  Darting movements of the tongue.    Psychiatric:        Mood and Affect: Mood normal.        Behavior: Behavior normal.     ED Results / Procedures / Treatments   Labs (all labs ordered are listed, but only abnormal results are displayed) Results for orders placed or performed during the hospital encounter of 06/23/22  Comprehensive metabolic panel  Result Value Ref Range   Sodium 137 135 - 145 mmol/L   Potassium 3.5 3.5 - 5.1 mmol/L   Chloride 104 98 - 111 mmol/L   CO2 25 22 - 32 mmol/L   Glucose, Bld 93 70 - 99 mg/dL   BUN 9 6 - 20 mg/dL   Creatinine, Ser 9.32 0.44 - 1.00 mg/dL   Calcium 9.1 8.9 - 35.5 mg/dL   Total Protein 7.9 6.5 - 8.1 g/dL   Albumin 4.4 3.5 - 5.0 g/dL   AST 19 15 - 41 U/L   ALT 14 0 - 44 U/L   Alkaline Phosphatase 55 38 - 126 U/L   Total Bilirubin 0.4 0.3 - 1.2 mg/dL   GFR, Estimated >73 >22 mL/min   Anion gap 8 5 - 15  CBC WITH DIFFERENTIAL  Result Value Ref Range   WBC 6.3 4.0 - 10.5 K/uL   RBC 4.27 3.87 - 5.11 MIL/uL   Hemoglobin 12.9 12.0 - 15.0 g/dL   HCT 02.5 42.7 - 06.2 %   MCV 92.5 80.0 - 100.0 fL   MCH 30.2 26.0 - 34.0 pg   MCHC 32.7 30.0 - 36.0 g/dL   RDW 37.6 28.3 - 15.1 %   Platelets 235 150 - 400 K/uL   nRBC 0.0 0.0 - 0.2 %   Neutrophils Relative % 51 %   Neutro Abs 3.3 1.7 - 7.7 K/uL   Lymphocytes Relative 37 %   Lymphs Abs 2.3 0.7 - 4.0 K/uL   Monocytes Relative 9 %   Monocytes Absolute 0.6 0.1 - 1.0 K/uL   Eosinophils Relative 2 %   Eosinophils Absolute 0.1 0.0 - 0.5 K/uL   Basophils Relative 1 %   Basophils Absolute 0.0 0.0 - 0.1 K/uL   Immature Granulocytes 0 %   Abs Immature Granulocytes 0.01 0.00 - 0.07 K/uL  hCG, serum,  qualitative  Result Value Ref Range   Preg,  Serum NEGATIVE NEGATIVE  CBG monitoring, ED  Result Value Ref Range   Glucose-Capillary 89 70 - 99 mg/dL   CT Head Wo Contrast  Result Date: 06/23/2022 CLINICAL DATA:  Blunt poly trauma. EXAM: CT HEAD WITHOUT CONTRAST TECHNIQUE: Contiguous axial images were obtained from the base of the skull through the vertex without intravenous contrast. RADIATION DOSE REDUCTION: This exam was performed according to the departmental dose-optimization program which includes automated exposure control, adjustment of the mA and/or kV according to patient size and/or use of iterative reconstruction technique. COMPARISON:  10/02/2020 FINDINGS: Brain: No evidence of acute infarction, hemorrhage, hydrocephalus, extra-axial collection or mass lesion/mass effect. Vascular: No hyperdense vessel or unexpected calcification. Skull: Normal. Negative for fracture or focal lesion. Sinuses/Orbits: No acute finding. IMPRESSION: Negative head CT. Electronically Signed   By: Tiburcio Pea M.D.   On: 06/23/2022 06:27     Procedures Procedures    Medications Ordered in ED Medications  benztropine mesylate (COGENTIN) injection 1 mg (1 mg Intramuscular Given 06/23/22 0637)  sodium chloride 0.9 % bolus 500 mL (500 mLs Intravenous New Bag/Given 06/23/22 0638)    EKG Interpretation  Date/Time:  Friday June 23 2022 06:47:01 EDT Ventricular Rate:  82 PR Interval:  147 QRS Duration: 95 QT Interval:  343 QTC Calculation: 401 R Axis:   67 Text Interpretation: Sinus rhythm Confirmed by Brittanya Winburn ([id number]) on 06/23/2022 6:49:41 AM         ED Course/ Medical Decision Making/ A&P                           Medical Decision Making Patient presented with complaints of inability to speak at all and complete left sided weakness.  LSN was some time yesterday.  Texted this am to inform parents of weakness.   Amount and/or Complexity of Data Reviewed Independent Historian: parent    Details: see above External Data Reviewed:  notes.    Details: previous notes reviewed Labs: ordered.    Details: All labs ordered by me: glucose is normal at 89. CBC is normal with normal white count 6.3 and normal hemoglobin 12.9 and normal platelet count 235K. pregnancy test is negative.  Sodium normal 137, normal potassium 3.5, normal creatinine .74. Radiology: ordered and independent interpretation performed.    Details: CT head is normal by my read.  CTV ordered by me. MRI ordered by me ECG/medicine tests: ordered and independent interpretation performed. Decision-making details documented in ED Course. Discussion of management or test interpretation with external provider(s): Case d/w Dr. Wilford Corner of neurology. CTV of the brain and MRI of the brain without contrast.  Benadryl vs Cogentin for tardive dyskinesia Case d/w Dr. Wallace Cullens who will accept the patient in transfer  Charge, Lew Dawes informed of ED to ED transfer   Risk Prescription drug management. Risk Details: Patient appeared to be trying to speak, and couldn't get any words out. Then when asked about medications abruptly began speaking, on point and with good fluidity.  Cogentin given as several of symptoms are consistent with tardive dyskinesia.  Patient was outside the window for stroke and appears to be improving well in the ED even prior to intervention. Given Cogentin in the ED and face symptoms have improved.  Speech is normal and arm and leg still feel shaky but do not appear to be jerking at this time.      Final Clinical Impression(s) /  ED Diagnoses Final diagnoses:  None   Transfer to [facility] ED for MRI of the brain     Faiza Bansal, MD 06/23/22 5749

## 2022-06-23 NOTE — ED Notes (Signed)
Patient transported to MRI 

## 2022-06-23 NOTE — ED Notes (Signed)
[facility] here to get pt report to Amy charge at [facility] ER

## 2022-06-23 NOTE — ED Notes (Signed)
Pt returned to ED bed.

## 2022-06-23 NOTE — ED Provider Notes (Signed)
Care transferred from [facility] for MRI to rule out stroke as a cause of difficulty speaking.  9:50 AM Patient is awaiting MRI.  I assessed patient and there is no difficulty with speech.  Patient reports no further stuttering.  If MRI is reassuring, dissipate discharge home.   2:04 PM MRI returned without evidence of acute stroke.  Patient is speaking much better and feels back to baseline.  Family and patient agree with discharge and patient discharged in good condition for outpatient PCP and neurology follow-up.  Clinical Impression: 1. Weakness   2. Transient speech disturbance     Disposition: Discharge  Condition: Good  I have discussed the results, Dx and Tx plan with the pt(& family if present). He/she/they expressed understanding and agree(s) with the plan. Discharge instructions discussed at great length. Strict return precautions discussed and pt &/or family have verbalized understanding of the instructions. No further questions at time of discharge.    New Prescriptions   No medications on file    Follow Up: [facility] [address]     [address] [place] [address] [phone]    Willow Ora, MD [address] [place] [address] [phone]     [facility] EMERGENCY DEPARTMENT [address] [id number] mc [place] [address] [phone]          Reeya Bound, Canary Brim, MD 06/23/22 [id number]

## 2022-06-23 NOTE — ED Triage Notes (Signed)
Parents state pt text them this morning and told them she was having left sided weakness and was unable to speak  Pt last known normal yesterday   Pt able to speak during triage

## 2022-07-06 ENCOUNTER — Encounter: Payer: Self-pay | Admitting: Family Medicine

## 2022-07-07 NOTE — Telephone Encounter (Signed)
error 

## 2022-08-04 ENCOUNTER — Telehealth: Payer: Self-pay | Admitting: Family Medicine

## 2022-08-04 NOTE — Telephone Encounter (Signed)
Patient wants genetics referral to be sent to [facility] at [facility] [address] [phone].

## 2022-08-05 ENCOUNTER — Emergency Department (HOSPITAL_BASED_OUTPATIENT_CLINIC_OR_DEPARTMENT_OTHER)
Admission: EM | Admit: 2022-08-05 | Discharge: 2022-08-05 | Disposition: A | Discharge status: Home / Self Care | Payer: Commercial Managed Care - HMO | Attending: Emergency Medicine | Admitting: Emergency Medicine

## 2022-08-05 ENCOUNTER — Other Ambulatory Visit: Payer: Self-pay

## 2022-08-05 ENCOUNTER — Emergency Department (HOSPITAL_BASED_OUTPATIENT_CLINIC_OR_DEPARTMENT_OTHER): Payer: Commercial Managed Care - HMO | Admitting: Radiology

## 2022-08-05 ENCOUNTER — Encounter (HOSPITAL_BASED_OUTPATIENT_CLINIC_OR_DEPARTMENT_OTHER): Payer: Self-pay | Admitting: Pediatrics

## 2022-08-05 DIAGNOSIS — F419 Anxiety disorder, unspecified: Secondary | ICD-10-CM | POA: Insufficient documentation

## 2022-08-05 DIAGNOSIS — J209 Acute bronchitis, unspecified: Secondary | ICD-10-CM | POA: Insufficient documentation

## 2022-08-05 DIAGNOSIS — J029 Acute pharyngitis, unspecified: Secondary | ICD-10-CM | POA: Diagnosis not present

## 2022-08-05 DIAGNOSIS — Z20822 Contact with and (suspected) exposure to covid-19: Secondary | ICD-10-CM | POA: Insufficient documentation

## 2022-08-05 DIAGNOSIS — R0602 Shortness of breath: Secondary | ICD-10-CM | POA: Diagnosis present

## 2022-08-05 LAB — SARS CORONAVIRUS 2 BY RT PCR: SARS Coronavirus 2 by RT PCR: NEGATIVE

## 2022-08-05 MED ORDER — ALBUTEROL SULFATE HFA 108 (90 BASE) MCG/ACT IN AERS
2.0000 | INHALATION_SPRAY | RESPIRATORY_TRACT | Status: DC | PRN
  Administered 2022-08-05: 2 via RESPIRATORY_TRACT
  Filled 2022-08-05: qty 6.7

## 2022-08-05 MED ORDER — ALBUTEROL SULFATE HFA 108 (90 BASE) MCG/ACT IN AERS: 1.0000 | INHALATION_SPRAY | Freq: Four times a day (QID) | RESPIRATORY_TRACT | 0 refills | Status: AC | PRN

## 2022-08-05 NOTE — ED Provider Notes (Signed)
[facility] EMERGENCY DEPT Provider Note   CSN: [id number] Arrival date & time: 08/05/22  1601     History  Chief Complaint  Patient presents with   Shortness of Breath   Cough    Chelsea Hammond is a 26 y.o. adult with history of bipolar disorder, anxiety, depression, and ADHD.  Presenting to the ED due to upper respiratory symptoms over the last week.  Reports new symptom of wheezing worsening over the last 24 hours.  [facility], and was unaware that she was diagnosed with acute bronchitis, acute pharyngitis, cough, and chills.  States she was given an antibiotic and discharged without further instruction.  She states she was told her "lungs are fine" and to continue resting at home.  Denies neck stiffness, fevers, N/V/D, urinary symptoms, abdominal pain, or chest pain.  No Hx of asthma or lung disease.  Also states she had a similar "wheezing" reaction when exposed to skunk spray last week.  No other complaints at this time.  The history is provided by the patient and medical records.  Shortness of Breath Associated symptoms: cough   Cough Associated symptoms: shortness of breath        Home Medications Prior to Admission medications   Medication Sig Start Date End Date Taking? Authorizing Provider  albuterol (VENTOLIN HFA) 108 (90 Base) MCG/ACT inhaler Inhale 1-2 puffs into the lungs every 6 (six) hours as needed for wheezing or shortness of breath. 08/05/22  Yes Prince Rome, PA-C  amphetamine-dextroamphetamine (ADDERALL XR) 15 MG 24 hr capsule Take 15 mg by mouth every morning.    [provider]  lamoTRIgine (LAMICTAL) 100 MG tablet Take 100 mg by mouth daily. 11/19/20   [provider]  sertraline (ZOLOFT) 50 MG tablet Take 50 mg by mouth daily. 11/19/20   [provider]  Testosterone Cypionate 200 MG/ML SOLN Inject as directed.    [provider]  traZODone (DESYREL) 50 MG  tablet  11/19/20   [provider]      Allergies    Codeine and Prednisone    Review of Systems   Review of Systems  Respiratory:  Positive for cough and shortness of breath.     Physical Exam Updated Vital Signs BP 111/65   Pulse 76   Temp 98.8 F (37.1 C)   Resp (!) 21   Ht 5\' 5"  (1.651 m)   Wt 59 kg   LMP 07/31/2022   SpO2 100%   BMI 21.63 kg/m  Physical Exam Vitals and nursing note reviewed.  Constitutional:      General: He is not in acute distress.    Appearance: He is well-developed. He is not ill-appearing, toxic-appearing or diaphoretic.  HENT:     Head: Normocephalic and atraumatic.     Mouth/Throat:     Mouth: Mucous membranes are moist.     Pharynx: Posterior oropharyngeal erythema (Mild-minimal) present. No pharyngeal swelling or oropharyngeal exudate.  Eyes:     Conjunctiva/sclera: Conjunctivae normal.  Neck:     Thyroid: No thyromegaly.  Cardiovascular:     Rate and Rhythm: Normal rate and regular rhythm.     Heart sounds: No murmur heard. Pulmonary:     Effort: Pulmonary effort is normal. No tachypnea, accessory muscle usage or respiratory distress.     Breath sounds: No stridor. No decreased breath sounds, wheezing (Minimal) or rales.  Chest:     Chest wall: Tenderness (Mild-minimal) present. No mass, deformity  or crepitus.  Abdominal:     Palpations: Abdomen is soft.     Tenderness: There is no abdominal tenderness.  Musculoskeletal:        General: No swelling.     Cervical back: Neck supple.  Lymphadenopathy:     Cervical: Cervical adenopathy (Mild) present.  Skin:    General: Skin is warm and dry.     Capillary Refill: Capillary refill takes less than 2 seconds.     Coloration: Skin is not cyanotic or pale.  Neurological:     Mental Status: He is alert and oriented to person, place, and time.  Psychiatric:        Mood and Affect: Mood is anxious.     ED Results / Procedures / Treatments   Labs (all labs ordered are  listed, but only abnormal results are displayed) Labs Reviewed  SARS CORONAVIRUS 2 BY RT PCR    EKG None  Radiology DG Chest 2 View  Result Date: 08/05/2022 CLINICAL DATA:  Shortness of breath EXAM: CHEST - 2 VIEW COMPARISON:  Chest x-ray 08/05/2022 FINDINGS: The heart size and mediastinal contours are within normal limits. Both lungs are clear. There is scoliosis of the thoracolumbar spine. No acute fractures are seen. IMPRESSION: No active cardiopulmonary disease. Electronically Signed   By: Darliss Cheney M.D.   On: 08/05/2022 17:04    Procedures Procedures    Medications Ordered in ED Medications  albuterol (VENTOLIN HFA) 108 (90 Base) MCG/ACT inhaler 2 puff (2 puffs Inhalation Given 08/05/22 1637)    ED Course/ Medical Decision Making/ A&P                           Medical Decision Making Amount and/or Complexity of Data Reviewed Radiology: ordered.  Risk Prescription drug management.   26 y.o. adult presents to the ED for concern of Shortness of Breath and Cough   This involves an extensive number of treatment options, and is a complaint that carries with it a high risk of complications and morbidity.  The emergent differential diagnosis prior to evaluation includes, but is not limited to: Acute viral pharyngitis, bacterial pharyngitis, pneumonia, bronchitis  This is not an exhaustive differential.   Past Medical History / Co-morbidities / Social History: Hx of bipolar disorder, ADHD, anxiety, depression Social Determinants of Health include: None  Additional History:  None  Lab Tests: I ordered, and personally interpreted labs.  The pertinent results include:   Covid negative  Imaging Studies: I ordered imaging studies including CXR.   I independently visualized and interpreted imaging which showed no evidence of acute cardiopulmonary pathology such as pneumonia, bronchial thickening, pneumothorax, pleural effusion, or cardiomegaly I agree with the  radiologist interpretation.  ED Course: Pt well-appearing on exam.  Nontoxic nonseptic appearing in NAD.  With URI symptoms for one week, new onset of "some wheezing" over last 12-24 hours.  Seen by UC, diagnosed with acute bronchitis.  Pt still feels uncomfortable and came to ED for further evaluation/second opinion.  No hx of asthma or lung disease.  No active shortness of breath. Mild wheeze without respiratory distress prior to 2 puffs of albuterol and CTAB with complete resolution appreciated after albuterol.  COVID-negative.  Patient and her friend reports remarkable improvement with use of albuterol since arrival to ED.  I received verbal permission from the patient to discuss medical information in front of her friend present.  Mild cervical lymphadenopathy.  Without throat soreness or dysphagia.  Nares clear.  Mild erythema of posterior oropharynx without tonsillar swelling or exudate.  Low suspicion of strep pharyngitis, RPA, or PTA.  Mild chest wall tenderness without evidence of rash, cellulitis, or skin infection.  Worse with occasional cough, likely related to such.  Satting at 100% on RA.  Sitting comfortably.  Does not appear in any respiratory distress.  Pt CXR negative for acute infiltrate.  Patients symptoms are consistent with URI, likely viral etiology. Was already prescribed antibiotic.  Provided education of URI and conservative symptom management.  Follow up closely with PCP if symptoms persistent.  Albuterol refill sent to pharmacy, recommend use of OTC decongestant and NSAIDs/Tylenol as needed.  Pt reports satisfaction with today's encounter.  Patient in NAD and in good condition at time of discharge.  Disposition: After consideration the patient's encounter today, I do not feel today's workup suggests an emergent condition requiring admission or immediate intervention beyond what has been performed at this time.  Safe for discharge; instructed to return immediately for worsening  symptoms, change in symptoms or any other concerns.  I have reviewed the patients home medicines and have made adjustments as needed.  Discussed course of treatment with the patient, whom demonstrated understanding.  Patient in agreement and has no further questions.    This chart was dictated using voice recognition software.  Despite best efforts to proofread, errors can occur which can change the documentation meaning.         Final Clinical Impression(s) / ED Diagnoses Final diagnoses:  Acute pharyngitis, unspecified etiology  Acute bronchitis, unspecified organism    Rx / DC Orders ED Discharge Orders          Ordered    albuterol (VENTOLIN HFA) 108 (90 Base) MCG/ACT inhaler  Every 6 hours PRN        08/05/22 1824              Cecil Cobbs, PA-C 08/11/22 1536    Arby Barrette, MD 08/13/22 1744

## 2022-08-05 NOTE — Discharge Instructions (Addendum)
There is no evidence of pneumonia on imaging today, and you have also tested negative for COVID.  You have been diagnosed with acute bronchitis and acute pharyngitis, which are respiratory infections usually caused by viruses, however can also be caused by bacteria.  Please continue taking your antibiotic as prescribed.  I have also sent a refill of the albuterol to your pharmacy.  Please take as directed, 1 to 2 puffs every 4-6 hours as needed.  Continue to manage your remaining symptoms at home with ibuprofen and Tylenol as needed.  Also focus on resting, hydrating, and keeping up with adequate nutritional intake.  Recommend following up with your primary care provider within the next 2 to 3 days for reevaluation continue medical management.  Also please visit the conversation of possible evaluation for acute airway disease such as asthma.  Return to the ED for new or worsening symptoms as discussed.

## 2022-08-05 NOTE — ED Notes (Signed)
Discharge instructions, follow up care, and prescription reviewed and explained, pt verbalized understanding. Pt caox4 and ambulatory on d/c.  

## 2022-08-05 NOTE — ED Triage Notes (Signed)
C/O cough, shortness of breath and some wheezing x 1 week; denies fever.

## 2022-08-07 ENCOUNTER — Encounter: Payer: Self-pay | Admitting: *Deleted

## 2022-08-07 NOTE — Telephone Encounter (Signed)
Message sent thru MyChart 

## 2022-08-25 ENCOUNTER — Emergency Department (HOSPITAL_BASED_OUTPATIENT_CLINIC_OR_DEPARTMENT_OTHER)
Admission: EM | Admit: 2022-08-25 | Discharge: 2022-08-25 | Disposition: A | Discharge status: Home / Self Care | Payer: Commercial Managed Care - HMO | Attending: Emergency Medicine | Admitting: Emergency Medicine

## 2022-08-25 ENCOUNTER — Encounter (HOSPITAL_BASED_OUTPATIENT_CLINIC_OR_DEPARTMENT_OTHER): Payer: Self-pay

## 2022-08-25 ENCOUNTER — Emergency Department (HOSPITAL_BASED_OUTPATIENT_CLINIC_OR_DEPARTMENT_OTHER): Payer: Commercial Managed Care - HMO | Admitting: Radiology

## 2022-08-25 ENCOUNTER — Other Ambulatory Visit: Payer: Self-pay

## 2022-08-25 DIAGNOSIS — R509 Fever, unspecified: Secondary | ICD-10-CM | POA: Diagnosis present

## 2022-08-25 DIAGNOSIS — U071 COVID-19: Secondary | ICD-10-CM | POA: Diagnosis not present

## 2022-08-25 LAB — BASIC METABOLIC PANEL
Anion gap: 9 (ref 5–15)
BUN: 10 mg/dL (ref 6–20)
CO2: 24 mmol/L (ref 22–32)
Calcium: 9.4 mg/dL (ref 8.9–10.3)
Chloride: 106 mmol/L (ref 98–111)
Creatinine, Ser: 0.71 mg/dL (ref 0.44–1.00)
GFR, Estimated: >60 mL/min (ref >60)
Glucose, Bld: 115 mg/dL — ABNORMAL HIGH (ref 70–99)
Potassium: 3.9 mmol/L (ref 3.5–5.1)
Sodium: 139 mmol/L (ref 135–145)

## 2022-08-25 LAB — CBC
HCT: 41.8 % (ref 36.0–46.0)
Hemoglobin: 13.8 g/dL (ref 12.0–15.0)
MCH: 30.3 pg (ref 26.0–34.0)
MCHC: 33 g/dL (ref 30.0–36.0)
MCV: 91.7 fL (ref 80.0–100.0)
Platelets: 196 10*3/uL (ref 150–400)
RBC: 4.56 MIL/uL (ref 3.87–5.11)
RDW: 14.6 % (ref 11.5–15.5)
WBC: 3.6 10*3/uL — ABNORMAL LOW (ref 4.0–10.5)
nRBC: 0 % (ref 0.0–0.2)

## 2022-08-25 LAB — RESP PANEL BY RT-PCR (FLU A&B, COVID) ARPGX2
Influenza A by PCR: NEGATIVE
Influenza B by PCR: NEGATIVE
SARS Coronavirus 2 by RT PCR: POSITIVE — AB

## 2022-08-25 LAB — TROPONIN I (HIGH SENSITIVITY): Troponin I (High Sensitivity): <2 ng/L (ref <18)

## 2022-08-25 LAB — HCG, SERUM, QUALITATIVE: Preg, Serum: NEGATIVE

## 2022-08-25 NOTE — ED Provider Notes (Signed)
Chelsea Hammond Provider Note   CSN: [id number] Arrival date & time: 08/25/22  1301     History Chief Complaint  Patient presents with   Cough    HPI Chelsea Hammond is a 26 y.o. adult presenting for chief complaint of cough for the past 2 weeks.  She endorses fever cough congestion intermittently.  Fever spike to 3 days ago.  Endorses active college attendance at this time.  Otherwise healthy up-to-date on vaccines.   Patient's recorded medical, surgical, social, medication list and allergies were reviewed in the Snapshot window as part of the initial history.   Review of Systems   Review of Systems  Constitutional:  Positive for fever. Negative for chills.  HENT:  Positive for congestion. Negative for ear pain and sore throat.   Eyes:  Negative for pain and visual disturbance.  Respiratory:  Positive for cough. Negative for shortness of breath.   Cardiovascular:  Negative for chest pain and palpitations.  Gastrointestinal:  Negative for abdominal pain and vomiting.  Genitourinary:  Negative for dysuria and hematuria.  Musculoskeletal:  Negative for arthralgias and back pain.  Skin:  Negative for color change and rash.  Neurological:  Negative for seizures and syncope.  All other systems reviewed and are negative.   Physical Exam Updated Vital Signs BP 117/75   Pulse 95   Temp 99.4 F (37.4 C)   Resp 18   Ht 5\' 5"  (1.651 m)   Wt 59 kg   LMP 07/31/2022   SpO2 98%   BMI 21.64 kg/m  Physical Exam Vitals and nursing note reviewed.  Constitutional:      General: He is not in acute distress.    Appearance: He is well-developed.  HENT:     Head: Normocephalic and atraumatic.  Eyes:     Conjunctiva/sclera: Conjunctivae normal.  Cardiovascular:     Rate and Rhythm: Normal rate and regular rhythm.     Heart sounds: No murmur heard. Pulmonary:     Effort: Pulmonary effort is normal. No respiratory distress.     Breath sounds: Normal  breath sounds.  Abdominal:     General: There is no distension.     Palpations: Abdomen is soft.     Tenderness: There is no abdominal tenderness. There is no right CVA tenderness or left CVA tenderness.  Musculoskeletal:        General: No swelling or tenderness. Normal range of motion.     Cervical back: Neck supple.  Skin:    General: Skin is warm and dry.  Neurological:     General: No focal deficit present.     Mental Status: He is alert and oriented to person, place, and time. Mental status is at baseline.     Cranial Nerves: No cranial nerve deficit.      ED Course/ Medical Decision Making/ A&P    Procedures Procedures   Medications Ordered in ED Medications - No data to display  Medical Decision Making:   Medical Decision Making:   [name] is a 26 y.o. adult who presented to the ED today with subjective fever, cough, congestion detailed above.    Patient's presentation is complicated by their history of multidrug outpatient psychiatric regimen.  Patient placed on continuous vitals and telemetry monitoring while in ED which was reviewed periodically.   Complete initial physical exam performed, notably the patient  was hemodynamically stable in no acute distress.  Posterior oropharynx illuminated and without obvious swelling or deformity.  Patient is  without neck stiffness.    Reviewed and confirmed nursing documentation for past medical history, family history, social history.    Initial Assessment:   With the patient's presentation of fever cough congestion, most likely diagnosis is developing viral upper respiratory infection. Other diagnoses were considered including (but not limited to) peritonsillar abscess, retropharyngeal abscess, pneumonia. These are considered less likely due to history of present illness and physical exam findings.   This is most consistent with an acute complicated illness Considered meningitis, however patient's symptoms, vital  signs, physical exam findings including lack of meningismus seem grossly less consistent at this time. Initial Plan:  Screening labs including CBC and Metabolic panel to evaluate for infectious or metabolic etiology of disease.  Viral screening including COVID/flu testing to evaluate for common viral etiologies that need to be tracked CXR to evaluate for structural/infectious intrathoracic pathology.  Empiric treatment with antipyretics including acetaminophen in ambulatory setting Objective evaluation as below reviewed   Initial Study Results:   Laboratory  All laboratory results reviewed without evidence of clinically relevant pathology.     Radiology:  All images reviewed independently. Agree with radiology report at this time.   DG Chest 2 View  Final Result         Final Assessment and Plan:   On reassessment, patient is ambulatory tolerating p.o. intake in no acute distress.   Patient's COVID test was positive and patient is not a candidate for treatment with Paxlovid due to lack of comorbidities and age  Patient is currently stable for outpatient care and management with no indication for hospitalization or transfer at this time.  Discussed all findings with patient expressed understanding.  Disposition:  Based on the above findings, I believe patient is stable for discharge.    Patient/family educated about specific return precautions for given chief complaint and symptoms.  Patient/family educated about follow-up with PCP.     Patient/family expressed understanding of return precautions and need for follow-up. Patient spoken to regarding all imaging and laboratory results and appropriate follow up for these results. All education provided in verbal form with additional information in written form. Time was allowed for answering of patient questions. Patient discharged.    Emergency Department Medication Summary:   Medications - No data to display         Clinical  Impression:  1. Fever, unspecified fever cause   2. COVID      Discharge   Clinical Impression:  1. Fever, unspecified fever cause   2. COVID      Discharge   Final Clinical Impression(s) / ED Diagnoses Final diagnoses:  Fever, unspecified fever cause  COVID    Rx / DC Orders ED Discharge Orders     None         Glyn Ade, MD 08/25/22 1456

## 2022-08-25 NOTE — ED Triage Notes (Signed)
Patient here POV from Home.  Endorses being seen for Cough and SOB approximately 2 Weeks ago and endorses continued Symptoms since.   Had Fever (101.9) on Sunday, Subsided since. Associated with Body Aches (Mainly to Joints), Lack of Appetite.   NAD Noted during Triage. A&Ox4. GCS 15. Ambulatory.

## 2022-08-25 NOTE — ED Notes (Signed)
Patient transported to X-ray, will bring to room when finished

## 2022-10-26 ENCOUNTER — Encounter: Payer: Self-pay | Admitting: *Deleted

## 2022-11-15 ENCOUNTER — Ambulatory Visit (INDEPENDENT_AMBULATORY_CARE_PROVIDER_SITE_OTHER): Payer: 59 | Admitting: Family Medicine

## 2022-11-15 ENCOUNTER — Encounter: Payer: Self-pay | Admitting: Family Medicine

## 2022-11-15 VITALS — BP 110/80 | HR 119 | Temp 98.8°F | Ht 65.0 in | Wt 135.0 lb

## 2022-11-15 DIAGNOSIS — Z23 Encounter for immunization: Secondary | ICD-10-CM

## 2022-11-15 DIAGNOSIS — M41129 Adolescent idiopathic scoliosis, site unspecified: Secondary | ICD-10-CM

## 2022-11-15 DIAGNOSIS — M249 Joint derangement, unspecified: Secondary | ICD-10-CM

## 2022-11-15 NOTE — Progress Notes (Signed)
Subjective  CC:  Chief Complaint  Patient presents with   Referral    Pt is needing a referral placed     HPI: Chelsea Hammond is a 27 y.o. adult who presents to the office today to address the problems listed above in the chief complaint. 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 Apparently EDS genetic testing was negative or equivocal.  Pt has back pain as well and has scoliosis.  Assessment  1. Hypermobility of joint   2. Adolescent idiopathic scoliosis, unspecified spinal region   3. Need for immunization against influenza      Plan  Hypermobile joints and joint pain:  refer to rheum per recommendations and pt request.  Flu shot updated today.  Follow up: for cpe as scheduled.  04/13/2023  Orders Placed This Encounter  Procedures   Flu Vaccine QUAD 56mo+IM (Fluarix, Fluzone & Alfiuria Quad PF)   Ambulatory referral to Rheumatology   No orders of the defined types were placed in this encounter.     I reviewed the patients updated PMH, FH, and SocHx.    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   amphetamine-dextroamphetamine (ADDERALL XR) 15 MG 24 hr capsule Take 15 mg by mouth every morning.   cloNIDine (CATAPRES) 0.1 MG tablet Take 0.1 mg by mouth at bedtime.   DULoxetine (CYMBALTA) 60 MG capsule Take 60 mg by mouth daily.   Testosterone Cypionate 200 MG/ML SOLN Inject as directed.   traZODone  (DESYREL) 50 MG tablet     Allergies: Patient is allergic to codeine and prednisone. Family History: Patient family history includes Diabetes in his father; GER disease in his father; Healthy in his mother; Hypertension in his father and another family member; Parkinson's disease in his paternal grandfather. Social History:  Patient  reports that he has never smoked. He has never used smokeless tobacco. He reports that he does not drink alcohol and does not use drugs.  Review of Systems: Constitutional: Negative for fever malaise or anorexia Cardiovascular: negative for chest pain Respiratory: negative for SOB or persistent cough Gastrointestinal: negative for abdominal pain  Objective  Vitals: BP 110/80   Pulse (!) 119   Temp 98.8 F (37.1 C)   Ht 5\' 5"  (1.651 m)   Wt 135 lb (61.2 kg)   SpO2 97%   BMI 22.47 kg/m  General: no acute distress , A&Ox3  Commons side effects, risks, benefits, and alternatives for medications and treatment plan prescribed today were discussed, and the patient expressed understanding of the given instructions. Patient is instructed to call or message via MyChart if he/she has any questions or concerns regarding our treatment plan. No barriers to understanding were identified. We discussed Red Flag symptoms and signs in detail. Patient expressed understanding regarding what to do in case of urgent or emergency type symptoms.  Medication list was reconciled, printed and provided to the patient in AVS. Patient instructions and summary information was reviewed with the  patient as documented in the AVS. This note was prepared with assistance of Dragon voice recognition software. Occasional wrong-word or sound-a-like substitutions may have occurred due to the inherent limitations of voice recognition software  This visit occurred during the SARS-CoV-2 public health emergency.  Safety protocols were in place, including screening questions prior to the visit,  additional usage of staff PPE, and extensive cleaning of exam room while observing appropriate contact time as indicated for disinfecting solutions.

## 2022-11-17 DIAGNOSIS — F3181 Bipolar II disorder: Secondary | ICD-10-CM | POA: Diagnosis not present

## 2022-11-17 DIAGNOSIS — F900 Attention-deficit hyperactivity disorder, predominantly inattentive type: Secondary | ICD-10-CM | POA: Diagnosis not present

## 2022-11-17 DIAGNOSIS — R69 Illness, unspecified: Secondary | ICD-10-CM | POA: Diagnosis not present

## 2022-11-20 ENCOUNTER — Telehealth: Payer: Self-pay

## 2022-11-20 NOTE — Telephone Encounter (Signed)
LVM for pt to let her know that rheumatology at [facility] have declined the referral and recommed a referral to sports medicine waiting to hear back from pt on whether she would like the referral to sports medicine

## 2022-12-06 DIAGNOSIS — M359 Systemic involvement of connective tissue, unspecified: Secondary | ICD-10-CM | POA: Diagnosis not present

## 2022-12-06 DIAGNOSIS — R69 Illness, unspecified: Secondary | ICD-10-CM | POA: Diagnosis not present

## 2022-12-06 DIAGNOSIS — M357 Hypermobility syndrome: Secondary | ICD-10-CM | POA: Diagnosis not present

## 2022-12-06 DIAGNOSIS — M2550 Pain in unspecified joint: Secondary | ICD-10-CM | POA: Diagnosis not present

## 2022-12-06 DIAGNOSIS — M419 Scoliosis, unspecified: Secondary | ICD-10-CM | POA: Diagnosis not present

## 2022-12-06 DIAGNOSIS — M791 Myalgia, unspecified site: Secondary | ICD-10-CM | POA: Diagnosis not present

## 2022-12-12 DIAGNOSIS — F900 Attention-deficit hyperactivity disorder, predominantly inattentive type: Secondary | ICD-10-CM | POA: Diagnosis not present

## 2022-12-12 DIAGNOSIS — F3181 Bipolar II disorder: Secondary | ICD-10-CM | POA: Diagnosis not present

## 2022-12-12 DIAGNOSIS — R69 Illness, unspecified: Secondary | ICD-10-CM | POA: Diagnosis not present

## 2022-12-19 DIAGNOSIS — F3181 Bipolar II disorder: Secondary | ICD-10-CM | POA: Diagnosis not present

## 2022-12-19 DIAGNOSIS — R69 Illness, unspecified: Secondary | ICD-10-CM | POA: Diagnosis not present

## 2022-12-19 DIAGNOSIS — F900 Attention-deficit hyperactivity disorder, predominantly inattentive type: Secondary | ICD-10-CM | POA: Diagnosis not present

## 2023-01-02 DIAGNOSIS — F3181 Bipolar II disorder: Secondary | ICD-10-CM | POA: Diagnosis not present

## 2023-01-02 DIAGNOSIS — F900 Attention-deficit hyperactivity disorder, predominantly inattentive type: Secondary | ICD-10-CM | POA: Diagnosis not present

## 2023-01-02 DIAGNOSIS — R69 Illness, unspecified: Secondary | ICD-10-CM | POA: Diagnosis not present

## 2023-01-30 DIAGNOSIS — F900 Attention-deficit hyperactivity disorder, predominantly inattentive type: Secondary | ICD-10-CM | POA: Diagnosis not present

## 2023-01-30 DIAGNOSIS — F411 Generalized anxiety disorder: Secondary | ICD-10-CM | POA: Diagnosis not present

## 2023-01-30 DIAGNOSIS — F3181 Bipolar II disorder: Secondary | ICD-10-CM | POA: Diagnosis not present

## 2023-02-27 DIAGNOSIS — F900 Attention-deficit hyperactivity disorder, predominantly inattentive type: Secondary | ICD-10-CM | POA: Diagnosis not present

## 2023-02-27 DIAGNOSIS — F411 Generalized anxiety disorder: Secondary | ICD-10-CM | POA: Diagnosis not present

## 2023-02-27 DIAGNOSIS — F3181 Bipolar II disorder: Secondary | ICD-10-CM | POA: Diagnosis not present

## 2023-03-27 DIAGNOSIS — F411 Generalized anxiety disorder: Secondary | ICD-10-CM | POA: Diagnosis not present

## 2023-03-27 DIAGNOSIS — F3181 Bipolar II disorder: Secondary | ICD-10-CM | POA: Diagnosis not present

## 2023-03-27 DIAGNOSIS — F900 Attention-deficit hyperactivity disorder, predominantly inattentive type: Secondary | ICD-10-CM | POA: Diagnosis not present

## 2023-04-13 ENCOUNTER — Encounter: Payer: 59 | Admitting: Family Medicine

## 2023-04-17 DIAGNOSIS — Q796 Ehlers-Danlos syndrome, unspecified: Secondary | ICD-10-CM | POA: Diagnosis not present

## 2023-04-17 DIAGNOSIS — G43909 Migraine, unspecified, not intractable, without status migrainosus: Secondary | ICD-10-CM | POA: Diagnosis not present

## 2023-04-17 DIAGNOSIS — R000 Tachycardia, unspecified: Secondary | ICD-10-CM | POA: Diagnosis not present

## 2023-05-07 DIAGNOSIS — Z1330 Encounter for screening examination for mental health and behavioral disorders, unspecified: Secondary | ICD-10-CM | POA: Diagnosis not present

## 2023-05-07 DIAGNOSIS — G43419 Hemiplegic migraine, intractable, without status migrainosus: Secondary | ICD-10-CM | POA: Diagnosis not present

## 2023-05-23 DIAGNOSIS — F411 Generalized anxiety disorder: Secondary | ICD-10-CM | POA: Diagnosis not present

## 2023-05-23 DIAGNOSIS — F3181 Bipolar II disorder: Secondary | ICD-10-CM | POA: Diagnosis not present

## 2023-05-23 DIAGNOSIS — F900 Attention-deficit hyperactivity disorder, predominantly inattentive type: Secondary | ICD-10-CM | POA: Diagnosis not present

## 2023-05-29 DIAGNOSIS — R000 Tachycardia, unspecified: Secondary | ICD-10-CM | POA: Diagnosis not present

## 2023-05-29 DIAGNOSIS — Q796 Ehlers-Danlos syndrome, unspecified: Secondary | ICD-10-CM | POA: Diagnosis not present

## 2023-05-29 DIAGNOSIS — G43909 Migraine, unspecified, not intractable, without status migrainosus: Secondary | ICD-10-CM | POA: Diagnosis not present

## 2023-06-19 DIAGNOSIS — F411 Generalized anxiety disorder: Secondary | ICD-10-CM | POA: Diagnosis not present

## 2023-06-19 DIAGNOSIS — F3181 Bipolar II disorder: Secondary | ICD-10-CM | POA: Diagnosis not present

## 2023-06-19 DIAGNOSIS — F900 Attention-deficit hyperactivity disorder, predominantly inattentive type: Secondary | ICD-10-CM | POA: Diagnosis not present

## 2023-07-03 DIAGNOSIS — F3181 Bipolar II disorder: Secondary | ICD-10-CM | POA: Diagnosis not present

## 2023-07-03 DIAGNOSIS — F411 Generalized anxiety disorder: Secondary | ICD-10-CM | POA: Diagnosis not present

## 2023-07-03 DIAGNOSIS — F900 Attention-deficit hyperactivity disorder, predominantly inattentive type: Secondary | ICD-10-CM | POA: Diagnosis not present

## 2023-07-18 DIAGNOSIS — R000 Tachycardia, unspecified: Secondary | ICD-10-CM | POA: Diagnosis not present

## 2023-07-24 DIAGNOSIS — R000 Tachycardia, unspecified: Secondary | ICD-10-CM | POA: Diagnosis not present

## 2023-07-24 DIAGNOSIS — Q796 Ehlers-Danlos syndrome, unspecified: Secondary | ICD-10-CM | POA: Diagnosis not present

## 2023-07-24 DIAGNOSIS — G43909 Migraine, unspecified, not intractable, without status migrainosus: Secondary | ICD-10-CM | POA: Diagnosis not present

## 2023-07-31 DIAGNOSIS — F900 Attention-deficit hyperactivity disorder, predominantly inattentive type: Secondary | ICD-10-CM | POA: Diagnosis not present

## 2023-07-31 DIAGNOSIS — F411 Generalized anxiety disorder: Secondary | ICD-10-CM | POA: Diagnosis not present

## 2023-07-31 DIAGNOSIS — F3181 Bipolar II disorder: Secondary | ICD-10-CM | POA: Diagnosis not present

## 2023-08-07 DIAGNOSIS — F649 Gender identity disorder, unspecified: Secondary | ICD-10-CM | POA: Diagnosis not present

## 2023-08-31 DIAGNOSIS — F411 Generalized anxiety disorder: Secondary | ICD-10-CM | POA: Diagnosis not present

## 2023-08-31 DIAGNOSIS — F3181 Bipolar II disorder: Secondary | ICD-10-CM | POA: Diagnosis not present

## 2023-08-31 DIAGNOSIS — F900 Attention-deficit hyperactivity disorder, predominantly inattentive type: Secondary | ICD-10-CM | POA: Diagnosis not present

## 2023-10-22 DIAGNOSIS — F4322 Adjustment disorder with anxiety: Secondary | ICD-10-CM | POA: Diagnosis not present

## 2023-11-29 DIAGNOSIS — F4322 Adjustment disorder with anxiety: Secondary | ICD-10-CM | POA: Diagnosis not present

## 2023-12-19 DIAGNOSIS — F4322 Adjustment disorder with anxiety: Secondary | ICD-10-CM | POA: Diagnosis not present

## 2023-12-27 DIAGNOSIS — F4322 Adjustment disorder with anxiety: Secondary | ICD-10-CM | POA: Diagnosis not present

## 2024-01-03 DIAGNOSIS — F4322 Adjustment disorder with anxiety: Secondary | ICD-10-CM | POA: Diagnosis not present

## 2024-01-08 DIAGNOSIS — F4322 Adjustment disorder with anxiety: Secondary | ICD-10-CM | POA: Diagnosis not present

## 2024-01-15 DIAGNOSIS — F4322 Adjustment disorder with anxiety: Secondary | ICD-10-CM | POA: Diagnosis not present

## 2024-01-28 DIAGNOSIS — F900 Attention-deficit hyperactivity disorder, predominantly inattentive type: Secondary | ICD-10-CM | POA: Diagnosis not present

## 2024-01-28 DIAGNOSIS — F411 Generalized anxiety disorder: Secondary | ICD-10-CM | POA: Diagnosis not present

## 2024-02-19 DIAGNOSIS — F649 Gender identity disorder, unspecified: Secondary | ICD-10-CM | POA: Diagnosis not present

## 2024-04-21 DIAGNOSIS — F3176 Bipolar disorder, in full remission, most recent episode depressed: Secondary | ICD-10-CM | POA: Diagnosis not present

## 2024-04-21 DIAGNOSIS — F900 Attention-deficit hyperactivity disorder, predominantly inattentive type: Secondary | ICD-10-CM | POA: Diagnosis not present
